# Patient Record
Sex: Male | Born: 1935 | Race: White | Hispanic: No | Marital: Single | Smoking: Former smoker
Health system: Southern US, Community
[De-identification: ages and names within clinical notes are randomized; demographics above are authoritative.]

## PROBLEM LIST (undated history)

## (undated) DIAGNOSIS — I639 Cerebral infarction, unspecified: Secondary | ICD-10-CM

## (undated) DIAGNOSIS — J449 Chronic obstructive pulmonary disease, unspecified: Secondary | ICD-10-CM

## (undated) DIAGNOSIS — I509 Heart failure, unspecified: Secondary | ICD-10-CM

---

## 2009-02-23 ENCOUNTER — Ambulatory Visit: Payer: Self-pay | Admitting: Rheumatology

## 2009-05-16 ENCOUNTER — Ambulatory Visit: Payer: Self-pay | Admitting: Cardiology

## 2009-06-19 ENCOUNTER — Ambulatory Visit: Payer: Self-pay | Admitting: Specialist

## 2009-11-26 ENCOUNTER — Ambulatory Visit: Payer: Self-pay | Admitting: General Practice

## 2009-12-12 ENCOUNTER — Inpatient Hospital Stay: Payer: Self-pay | Admitting: General Practice

## 2009-12-18 ENCOUNTER — Encounter: Payer: Self-pay | Admitting: Internal Medicine

## 2010-03-01 ENCOUNTER — Ambulatory Visit: Payer: Self-pay | Admitting: General Practice

## 2010-03-15 ENCOUNTER — Inpatient Hospital Stay: Payer: Self-pay | Admitting: General Practice

## 2010-04-26 ENCOUNTER — Encounter: Payer: Self-pay | Admitting: General Practice

## 2010-05-01 ENCOUNTER — Encounter: Payer: Self-pay | Admitting: General Practice

## 2010-09-04 ENCOUNTER — Ambulatory Visit: Payer: Self-pay | Admitting: Unknown Physician Specialty

## 2010-09-06 LAB — PATHOLOGY REPORT

## 2011-02-14 ENCOUNTER — Inpatient Hospital Stay: Payer: Self-pay | Admitting: Internal Medicine

## 2011-10-18 ENCOUNTER — Inpatient Hospital Stay: Payer: Self-pay | Admitting: Internal Medicine

## 2011-10-22 ENCOUNTER — Emergency Department: Payer: Self-pay | Admitting: *Deleted

## 2011-10-23 ENCOUNTER — Inpatient Hospital Stay: Payer: Self-pay

## 2012-03-17 ENCOUNTER — Other Ambulatory Visit: Payer: Self-pay

## 2012-10-25 ENCOUNTER — Inpatient Hospital Stay: Payer: Self-pay | Admitting: Internal Medicine

## 2012-10-26 LAB — COMPREHENSIVE METABOLIC PANEL
Albumin: 3.4 g/dL (ref 3.4–5.0)
Anion Gap: 5 — ABNORMAL LOW (ref 7–16)
BUN: 25 mg/dL — ABNORMAL HIGH (ref 7–18)
Bilirubin,Total: 0.5 mg/dL (ref 0.2–1.0)
Chloride: 96 mmol/L — ABNORMAL LOW (ref 98–107)
Co2: 34 mmol/L — ABNORMAL HIGH (ref 21–32)
EGFR (African American): >60
Glucose: 153 mg/dL — ABNORMAL HIGH (ref 65–99)
Osmolality: 278 (ref 275–301)
Potassium: 4 mmol/L (ref 3.5–5.1)
SGOT(AST): 29 U/L (ref 15–37)
SGPT (ALT): 19 U/L (ref 12–78)
Sodium: 135 mmol/L — ABNORMAL LOW (ref 136–145)
Total Protein: 7.1 g/dL (ref 6.4–8.2)

## 2012-10-26 LAB — CBC WITH DIFFERENTIAL/PLATELET
Basophil #: 0 10*3/uL (ref 0.0–0.1)
Basophil %: 0.1 %
Eosinophil %: 0.1 %
HCT: 36.9 % — ABNORMAL LOW (ref 40.0–52.0)
Lymphocyte #: 0.2 10*3/uL — ABNORMAL LOW (ref 1.0–3.6)
Lymphocyte %: 2.4 %
MCV: 83 fL (ref 80–100)
Monocyte %: 0.9 %
Neutrophil #: 6.8 10*3/uL — ABNORMAL HIGH (ref 1.4–6.5)
RBC: 4.46 10*6/uL (ref 4.40–5.90)
RDW: 14.5 % (ref 11.5–14.5)
WBC: 7.1 10*3/uL (ref 3.8–10.6)

## 2012-10-29 LAB — BASIC METABOLIC PANEL
Anion Gap: 3 — ABNORMAL LOW (ref 7–16)
BUN: 45 mg/dL — ABNORMAL HIGH (ref 7–18)
Calcium, Total: 9 mg/dL (ref 8.5–10.1)
Co2: 35 mmol/L — ABNORMAL HIGH (ref 21–32)
EGFR (African American): 54 — ABNORMAL LOW
EGFR (Non-African Amer.): 46 — ABNORMAL LOW
Glucose: 126 mg/dL — ABNORMAL HIGH (ref 65–99)
Osmolality: 281 (ref 275–301)
Potassium: 4.9 mmol/L (ref 3.5–5.1)

## 2012-10-29 LAB — CBC WITH DIFFERENTIAL/PLATELET
Basophil #: 0 10*3/uL (ref 0.0–0.1)
Basophil %: 0 %
Eosinophil #: 0 10*3/uL (ref 0.0–0.7)
HGB: 12.4 g/dL — ABNORMAL LOW (ref 13.0–18.0)
Lymphocyte %: 4.1 %
MCHC: 31.9 g/dL — ABNORMAL LOW (ref 32.0–36.0)
Monocyte %: 5.3 %
Neutrophil %: 90.6 %
Platelet: 227 10*3/uL (ref 150–440)
RBC: 4.68 10*6/uL (ref 4.40–5.90)
RDW: 14.3 % (ref 11.5–14.5)
WBC: 11.7 10*3/uL — ABNORMAL HIGH (ref 3.8–10.6)

## 2012-10-30 LAB — BASIC METABOLIC PANEL
Anion Gap: 3 — ABNORMAL LOW (ref 7–16)
Calcium, Total: 8.7 mg/dL (ref 8.5–10.1)
Co2: 35 mmol/L — ABNORMAL HIGH (ref 21–32)
EGFR (African American): >60
Osmolality: 283 (ref 275–301)

## 2012-10-30 LAB — CBC WITH DIFFERENTIAL/PLATELET
Basophil #: 0 10*3/uL (ref 0.0–0.1)
Basophil %: 0.1 %
Eosinophil #: 0 10*3/uL (ref 0.0–0.7)
Eosinophil %: 0 %
HGB: 12.6 g/dL — ABNORMAL LOW (ref 13.0–18.0)
Lymphocyte #: 0.6 10*3/uL — ABNORMAL LOW (ref 1.0–3.6)
Lymphocyte %: 5.3 %
MCV: 83 fL (ref 80–100)
Monocyte #: 0.9 x10 3/mm (ref 0.2–1.0)
Monocyte %: 7.3 %
Neutrophil %: 87.3 %
Platelet: 230 10*3/uL (ref 150–440)
RBC: 4.63 10*6/uL (ref 4.40–5.90)
RDW: 14.1 % (ref 11.5–14.5)
WBC: 11.8 10*3/uL — ABNORMAL HIGH (ref 3.8–10.6)

## 2012-10-30 LAB — PRO B NATRIURETIC PEPTIDE: B-Type Natriuretic Peptide: 227 pg/mL (ref 0–450)

## 2012-10-31 LAB — CBC WITH DIFFERENTIAL/PLATELET
Basophil #: 0 10*3/uL (ref 0.0–0.1)
Eosinophil %: 0.1 %
HCT: 37.6 % — ABNORMAL LOW (ref 40.0–52.0)
HGB: 11.9 g/dL — ABNORMAL LOW (ref 13.0–18.0)
MCH: 26.5 pg (ref 26.0–34.0)
MCV: 83 fL (ref 80–100)
Monocyte #: 0.8 x10 3/mm (ref 0.2–1.0)
Monocyte %: 6.9 %
Neutrophil %: 85.4 %
Platelet: 216 10*3/uL (ref 150–440)
RBC: 4.5 10*6/uL (ref 4.40–5.90)

## 2012-10-31 LAB — BASIC METABOLIC PANEL
Anion Gap: 3 — ABNORMAL LOW (ref 7–16)
Calcium, Total: 8.2 mg/dL — ABNORMAL LOW (ref 8.5–10.1)
Chloride: 101 mmol/L (ref 98–107)
Co2: 36 mmol/L — ABNORMAL HIGH (ref 21–32)
Glucose: 101 mg/dL — ABNORMAL HIGH (ref 65–99)
Osmolality: 286 (ref 275–301)
Potassium: 5 mmol/L (ref 3.5–5.1)

## 2012-10-31 LAB — OCCULT BLOOD X 1 CARD TO LAB, STOOL: Occult Blood, Feces: POSITIVE

## 2012-11-01 LAB — CBC WITH DIFFERENTIAL/PLATELET
Basophil #: 0 10*3/uL (ref 0.0–0.1)
Basophil %: 0.1 %
Eosinophil %: 0 %
HGB: 11.6 g/dL — ABNORMAL LOW (ref 13.0–18.0)
Lymphocyte %: 5.9 %
MCH: 26.1 pg (ref 26.0–34.0)
Neutrophil %: 88.2 %
Platelet: 220 10*3/uL (ref 150–440)
RBC: 4.45 10*6/uL (ref 4.40–5.90)
WBC: 11.7 10*3/uL — ABNORMAL HIGH (ref 3.8–10.6)

## 2012-11-01 LAB — BASIC METABOLIC PANEL
Anion Gap: 3 — ABNORMAL LOW (ref 7–16)
BUN: 32 mg/dL — ABNORMAL HIGH (ref 7–18)
Calcium, Total: 8.4 mg/dL — ABNORMAL LOW (ref 8.5–10.1)
Creatinine: 1.21 mg/dL (ref 0.60–1.30)
EGFR (African American): >60
EGFR (Non-African Amer.): 57 — ABNORMAL LOW
Glucose: 118 mg/dL — ABNORMAL HIGH (ref 65–99)
Osmolality: 282 (ref 275–301)
Potassium: 5.5 mmol/L — ABNORMAL HIGH (ref 3.5–5.1)

## 2012-11-02 LAB — HEMOGLOBIN: HGB: 11.1 g/dL — ABNORMAL LOW (ref 13.0–18.0)

## 2013-07-01 ENCOUNTER — Ambulatory Visit: Payer: Self-pay | Admitting: Physician Assistant

## 2013-07-01 ENCOUNTER — Observation Stay: Payer: Self-pay | Admitting: Internal Medicine

## 2013-07-02 LAB — CBC WITH DIFFERENTIAL/PLATELET
Basophil #: 0.1 10*3/uL (ref 0.0–0.1)
Eosinophil %: 2.8 %
HGB: 12.1 g/dL — ABNORMAL LOW (ref 13.0–18.0)
Lymphocyte %: 15.9 %
MCV: 80 fL (ref 80–100)
Monocyte #: 0.8 x10 3/mm (ref 0.2–1.0)
Neutrophil #: 6.3 10*3/uL (ref 1.4–6.5)
Neutrophil %: 71.6 %
RBC: 4.5 10*6/uL (ref 4.40–5.90)
RDW: 15.1 % — ABNORMAL HIGH (ref 11.5–14.5)

## 2013-07-02 LAB — COMPREHENSIVE METABOLIC PANEL
Albumin: 3.2 g/dL — ABNORMAL LOW (ref 3.4–5.0)
Alkaline Phosphatase: 48 U/L — ABNORMAL LOW (ref 50–136)
Bilirubin,Total: 0.3 mg/dL (ref 0.2–1.0)
Calcium, Total: 8.9 mg/dL (ref 8.5–10.1)
Chloride: 101 mmol/L (ref 98–107)
EGFR (African American): >60
Glucose: 83 mg/dL (ref 65–99)
Osmolality: 276 (ref 275–301)
Sodium: 138 mmol/L (ref 136–145)
Total Protein: 6.3 g/dL — ABNORMAL LOW (ref 6.4–8.2)

## 2014-06-21 DIAGNOSIS — I10 Essential (primary) hypertension: Secondary | ICD-10-CM | POA: Insufficient documentation

## 2015-03-20 NOTE — Consult Note (Signed)
Chief Complaint:   Subjective/Chief Complaint Please see full Gi consult.  Patient admitted with shortness of breath in the setting of known history of pulmonary fibrosis.  Patietn with some rectal bleeding, likely anal outlet in nature.  Hemodynamically stable, no acute significant drop of hgb. Please see full consult for recomendations. Following.   VITAL SIGNS/ANCILLARY NOTES: **Vital Signs.:   02-Dec-13 13:18   Vital Signs Type Routine   Temperature Temperature (F) 98.1   Celsius 36.7   Temperature Source oral   Pulse Pulse 86   Respirations Respirations 18   Systolic BP Systolic BP 97   Diastolic BP (mmHg) Diastolic BP (mmHg) 60   Mean BP 72   Pulse Ox % Pulse Ox % 95   Pulse Ox Activity Level  At rest   Oxygen Delivery 2L   Electronic Signatures: Barnetta ChapelSkulskie, Martin (MD)  (Signed 02-Dec-13 19:46)  Authored: Chief Complaint, VITAL SIGNS/ANCILLARY NOTES   Last Updated: 02-Dec-13 19:46 by Barnetta ChapelSkulskie, Martin (MD)

## 2015-03-20 NOTE — Discharge Summary (Signed)
PATIENT NAME:  Kyle CockerHOLLAND, Kyle Frederick MR#:  [id number] DATE OF BIRTH:  [birth year]  DATE OF ADMISSION:  10/25/2012 DATE OF DISCHARGE:  11/03/2012  REASON FOR ADMISSION: Worsening shortness of breath.   HISTORY OF PRESENT ILLNESS: Please see the dictated history and physical done by DR. Walker, III on 10/25/2012.   PAST MEDICAL HISTORY:  1.  Previous stroke with partial expressive aphasia.  2.  Benign hypertension.  3.  Pulmonary hypertension.  4.  Interstitial lung disease.  5.  Seronegative inflammatory arthritis.  6.  Osteoarthritis.  7.  Mitral regurgitation.  8.  Cardiomegaly.  9.  Status post bilateral knee surgeries.  10. History of prostate biopsy.  11. Status post left inguinal hernia repair.   MEDICATIONS ON ADMISSION: Please see admission note.   ALLERGIES: QUESTIONABLE ALLERGY TO LASIX.   SOCIAL HISTORY: Negative for alcohol or tobacco abuse.   FAMILY HISTORY: Positive for heart disease and cerebrovascular disease.   REVIEW OF SYSTEMS: As per history of present illness.   PHYSICAL EXAMINATION:  GENERAL: The patient was congested in mild respiratory distress.   VITAL SIGNS: Remarkable for temperature of 100.1 with a heart rate of 106 and a pressure of 119/70. Saturations were 92% on 2 L.  HEENT: Normocephalic, atraumatic. Pupils equally round and reactive to light and accommodation. Extraocular movements are intact. Sclerae are anicteric. Conjunctivae are clear. Oropharynx was dry, but clear.  NECK: Supple without JVD.  LUNGS: Scattered rhonchi. No wheezes or rales.  CARDIAC: Irregularly irregular rhythm with a rapid rate. No murmurs, or gallops.  ABDOMEN: Soft and nontender.  EXTREMITIES: Without edema.  NEUROLOGIC: Grossly nonfocal.   HOSPITAL COURSE: The patient was admitted with acute on chronic respiratory failure. The patient was treated conservatively with IV antibiotics and high-dose steroids with DuoNeb SVNs. He was seen by pulmonology who agreed with the  recommendations. However, during the hospitalization, there was some question in regards to GI bleed. He was seen in consultation by gastroenterology, who felt that the patient had rectal bleeding, presumably diverticular. He was monitored closely. During the hospitalization, echocardiogram was obtained and revealed an ejection fraction of greater than 55%. No mitral regurgitation was noted on this echo. The patient was subsequently weaned to oral steroids and oral antibiotics. He continued to do well. He was maintained on oxygen at 2 L. By 11/03/2012, the patient was stable and ready for discharge.   DISCHARGE DIAGNOSES:  1.  Acute on chronic respiratory failure.  2.  Chronic obstructive pulmonary disease exacerbation.  3.  Pneumonia.  4.  Rectal bleeding, questionable diverticular.  5.  Hyperlipidemia.  6.  Seronegative inflammatory arthritis.  7.  Benign prostatic hypertrophy.   DISCHARGE MEDICATIONS:  1.  Aspirin 81 mg p.o. daily.  2.  Flomax 0.4 mg p.o. at bedtime.  3.  Zocor 20 mg p.o. daily.  4.  Zestoretic 20/25, 1 p.o. daily.  5.  Spiriva 1 capsule inhaled daily.  6.  Proscar 5 mg p.o. daily.  7.  Cardizem CD 180 mg p.o. daily.  8.  Prednisone taper as directed.   FOLLOW-UP PLANS AND APPOINTMENTS: The patient was discharged on home oxygen with home health. He was on a regular diet. He will follow with Dr. Dan HumphreysWalker within 1 to 2 weeks' time period, sooner if needed.   TOTAL TIME SPENT: 45 minutes.     ____________________________ Duane LopeJeffrey Frederick. Judithann SheenSparks, MD jds:aw Frederick: 11/17/2012 07:15:00 ET T: 11/17/2012 08:25:12 ET JOB#: [id number]  cc: Letta PateJohn B. Danne HarborWalker III, MD Duane LopeJeffrey Frederick. Tinsleigh Slovacek,  MD, <Dictator>   Ariyannah Pauling Rodena Medin MD ELECTRONICALLY SIGNED 11/17/2012 20:51

## 2015-03-20 NOTE — Consult Note (Signed)
PATIENT NAME:  Kyle Frederick, Kyle Frederick MR#:  [id number] DATE OF BIRTH:  [birth year]  DATE OF CONSULTATION:  11/01/2012  REFERRING PHYSICIAN:  Dr. Dan Humphreys  CONSULTING PHYSICIAN:  Keturah Barre, NP  REASON FOR CONSULTATION: Possible GI bleed.    HISTORY OF PRESENT ILLNESS: We appreciate this consult for this 81 year old Caucasian man admitted with pneumonia/pulmonary fibrosis/COPD exacerbation. Noted he has had one bloody and heme positive stool. This was yesterday. Reports a history of chronic constipation that he treats at home with Dulcolax tablets. States he needs to strain with defecation chronically and having hard stools. States he has had one bowel movement after two suppositories during this hospitalization yesterday. He states that there was no stool but it was bright red blood in the toilet and that he could see the toilet bottom. Denies abdominal pain, cramping, nausea, and further GI-related complaints with the exception of dysphagia and heartburn for which he takes Rolaids at home for this but no PPI. States follow-up in the past. Did have colonoscopy in 2011 with one adenoma, three hyperplastic polyps, diverticula, and internal hemorrhoids. Also had EGD at the time with esophagitis, gastritis, and duodenitis. There was H. pylori noted. He was treated with a press pack.   PAST MEDICAL HISTORY:  1. Hypertension. 2. CVA. 3. Partial expressive aphasia.  4. Interstitial lung disease. 5. Mild pulmonary hypertension. 6. Cardiomegaly. 7. Mitral regurgitation. 8. Seronegative inflammatory arthritis. 9. Osteoarthritis. 10. Bilateral knee replacements. 11. Prostate biopsy. 12. Left inguinal hernia repair.   ALLERGIES: Faintness after taking a dose of Lasix but no other allergies.    MEDICATIONS:  1. Ecotrin 325 mg p.o. daily.  2. Spiriva 1 puff daily. 3. Simvastatin 20 mg p.o. daily.  4. Lisinopril 5 mg p.o. daily.  5. Flomax 0.4 mg p.o. daily.   FAMILY HISTORY: Significant for  heart disease and CVA.  REVIEW OF SYSTEMS: 10 point review is essentially negative with the exception of what is written above and his shortness of breath on admission. States that his breathing is feeling better, although it is not back to 100%.   MOST RECENT LABS: Glucose 118, BUN 32, creatinine 1.21, sodium 137, potassium 5.5, chloride 99, GFR 57, calcium 8.4, WBC 11.7, hemoglobin 11.6, hematocrit 37, platelet count 220.   PHYSICAL EXAMINATION:   MOST RECENT VITAL SIGNS: Temperature 98.1, pulse 75, respiratory rate 18, blood pressure 132/64, SAO2 96% on 2 liters oxygen.   GENERAL: Elderly man resting comfortably in bed in no acute distress.   HEENT: Normocephalic, atraumatic. No redness, drainage, or inflammation to the eyes or the nares. Sclerae anicteric.   LUNGS: Without JVD, lymphadenopathy, or thyromegaly.   LUNGS: Respirations eupneic. Able to speak in complete sentences. Bilateral wheezing and rhonchi noted. Mild cough.   CARDIAC: S1, S2, regular rate and rhythm. No MRG. Trace lower extremity edema. Peripheral pulses 2+.   ABDOMEN: Somewhat protuberant. Bowel sounds x4, very soft, nontender, nondistended. No guarding, rigidity, peritoneal signs, hepatosplenomegaly, or other abnormalities noted.   RECTAL: No external hemorrhoids. Did note some hard stool in rectal vault. No frank bleeding and no blackness on glove. No melena on glove either.   GU: Deferred.   EXTREMITIES: Moves all extremities well x4. Strength 5/5. Sensation intact. No cyanosis.   NEUROLOGICAL: Cranial nerves II through XII intact. Alert and oriented x3. Did note some partial expressive aphasia but is able to communicate well.   PSYCHIATRIC: Pleasant, cooperative, calm, logical thought.   IMPRESSION AND PLAN:  1. Rectal bleeding. Differentials include anal  outlet, diverticular, other. This is a single episode yesterday so likely anal outlet. Would like to improve stool softness. Have recommended Citrucel  once daily and MiraLAX daily for now with Dulcolax p.r.n. only. Do recommend following hemoglobin and monitoring for further signs of bleeding. 2. Gastroesophageal reflux disease and dysphagia. The patient states this is a longstanding problem, not on PPI currently. Will assess H. pylori serologies. Pantoprazole 40 mg p.o. daily. May or may not need barium swallow this visit.  These services were provided by Kyle Pathristiane Sumiya Mamaril, MSN, [facility] in collaboration with Barnetta ChapelMartin Skulskie, MD with whom I have discussed this patient in full.   Thank you for this consult.   ____________________________ Keturah Barrehristiane H. Arsh Feutz, NP chl:drc D: 11/02/2012 11:32:00 ET T: 11/02/2012 11:53:36 ET JOB#: [id number]  cc: Keturah Barrehristiane H. Jaszmine Navejas, NP, <Dictator> Eustaquio MaizeHRISTIANE H Ajahnae Rathgeber FNP ELECTRONICALLY SIGNED 11/10/2012 10:55

## 2015-03-20 NOTE — Consult Note (Signed)
Chief Complaint:   Subjective/Chief Complaint no further rectal bleeding, tolerating regular diet.  no nausea or abdominal pain.  patient reports breathing much better.   VITAL SIGNS/ANCILLARY NOTES: **Vital Signs.:   03-Dec-13 14:30   Vital Signs Type Routine   Temperature Temperature (F) 98.4   Celsius 36.8   Temperature Source Oral   Pulse Pulse 77   Respirations Respirations 20   Systolic BP Systolic BP 127   Diastolic BP (mmHg) Diastolic BP (mmHg) 61   Mean BP 83   Pulse Ox % Pulse Ox % 95   Pulse Ox Activity Level  At rest   Oxygen Delivery 2L   Brief Assessment:   Cardiac Regular    Respiratory wheezing  rhonchi  bilateral    Gastrointestinal details normal Soft  Nontender  Nondistended  No masses palpable  Bowel sounds normal   Lab Results: Cardiology:  03-Dec-13 08:08    Echo Doppler  Interpretation Summary   Left ventricular systolic function is normal. Ejection Fraction =  >55%. The left atrium is mildly dilated. Mild pulmonic valvular  regurgitation. The right ventricle is mildly dilated. There is no  mitral regurgitationnoted. No pericardial effusion  Procedure:   A two-dimensional transthoracic echocardiogram with color flow and  Doppler was performed.  Left Ventricle   Left ventricular systolic function is normal.   Ejection Fraction = >55%.  Right Ventricle   The right ventricle is mildly dilated.  Atria   The left atrium is mildly dilated.  Mitral Valve   The mitral valve is not well visualized.   There is no mitral regurgitation noted.  Tricuspid Valve   The tricuspid valve is not well visualized.   There is trace tricuspid regurgitation.  Aortic Valve   The aortic valve is not well visualized.   No aortic regurgitation is present.  Pulmonic Valve   Mild pulmonic valvular regurgitation.  Pericardium/Pleural   No pericardial effusion.  MMode 2D Measurements and Calculations   RVDd: 4.5 cm   IVSd: 1.1 cm   LVIDd: 4.8 cm  LVIDs: 3.0 cm   LVPWd: 1.1 cm   FS: 38 %   EF(Teich): 68 %   Ao root diam: 3.4 cm   LA dimension: 4.9 cm   LVOT diam: 2.2 cm  Doppler Measurements and Calculations   MV E point: 64 cm/sec   MV A point: 83 cm/sec   MV E/A: 0.76    MV dec time: 0.41 sec   Ao V2 max: 98 cm/sec   Ao max PG: 4.0 mmHg   AVA(V,D): 2.6 cm2   LV max PG: 2.0 mmHg   LV V1 max: 66 cm/sec   PA V2 max: 92cm/sec   PA max PG: 3.0 mmHg   TR Max vel: 181 cm/sec   TR Max PG: 13 mmHg   RVSP: 18 mmHg   RAP systole: 5.0 mmHg  Reading Physician: Harold Hedge  Sonographer: Cristela Blue Interpreting Physician:  Harold Hedge,  electronically signed on 11-02-2012 13:45:27 Requesting Physician: Harold Hedge  Routine Hem:  03-Dec-13 05:01    Hemoglobin (CBC)  11.1 (Result(s) reported on 02 Nov 2012 at 05:57AM.)   Assessment/Plan:  Assessment/Plan:   Assessment 1) copd/pulmonary fibrosis improved over admission.  2) rectal bleeding-history of constipation, large bm yesterday without bleeding. likely anal outlet bleeding.   now on miralax and citrucel.    Plan 1) not a candidate for luminal evaluation currently due to recent exacerbation of lung disease.  recommend non-sedated proceedure such as CT colonography  as outpatient.  We can arrange this in GI followup.  Have patietn see GI as outpatient in 2-3 weeks.  Will sign off.  reconsult if needed.   Electronic Signatures: Barnetta ChapelSkulskie, Bannon Giammarco (MD)  (Signed 03-Dec-13 17:06)  Authored: Chief Complaint, VITAL SIGNS/ANCILLARY NOTES, Brief Assessment, Lab Results, Assessment/Plan   Last Updated: 03-Dec-13 17:06 by Barnetta ChapelSkulskie, Taegan Haider (MD)

## 2015-03-20 NOTE — Consult Note (Signed)
Brief Consult Note: Diagnosis: pneumonia/COPD exacerbation.   Patient was seen by consultant.   Consult note dictated.   Comments: Appreciate consult for 81 y/o caucasion man admitted with pneumonia/pulmonary fibrosis/COPD exacerabtion for evaluation of bloody/heme positive stool. Reports a history of chronic constipation that he treats at home with Dulcolax tablets. States needing to strain with defecation chronically and having hard stools. States he has had one bowel movement after 2 suppositories- this was yesterday, states that there was no stool, that it was bright red blood in the toilet, although he could see the toilet bottom. Denies abdominal pain, cramping, nausea, and further GI related complaints with the exception of dysphagia and heartburn. Takes Rolaids at home for this, but no PPI. States that both solids and liquids are hard to pass. Did have colonoscopy 2011 with one adenoma, 3 hyperplastic polyps, diverticula, internal hemorrhoids. Also had EGD at that time with esophagitis, gastritis, and duodenitis. There was H pylori noted and he was treated with a Prevpac. Abdomen nondistended, nontender with active bowel sounds. Rectal exam without frank bleeding, melena, and was nontender. Hgb is stable since admission.  Impression and plan. Rectal bleeding- differentials include anal outlet, diverticular, other. This was a single episode yesterday- so likely anal outlet. Would like to improved stool softness- have recommended citrucel once daily and miralax once daily for now. Would save dulcolax to prn only. Do recommend following hemoglobin and monitoring for further bleeding.                                GERD and dysphagia.Patient states this is a long standing problem.  Not on PPI currently. Will assess H pylori serologies and write for Pantoprazole 40mg  po daily. May need barium swallow.  Electronic Signatures: Vevelyn PatLondon, Takeysha Bonk H (NP)  (Signed 02-Dec-13 15:00)  Authored: Brief  Consult Note   Last Updated: 02-Dec-13 15:00 by Keturah BarreLondon, Tamee Battin H (NP)

## 2015-03-20 NOTE — H&P (Signed)
PATIENT NAME:  Kyle Frederick, Kyle D MR#:  [id number] DATE OF BIRTH:  [birth year]  DATE OF ADMISSION:  10/25/2012  HISTORY OF PRESENT ILLNESS: Kyle Frederick is a 81 year old white gentleman with known pulmonary fibrosis. He went to see his pulmonologist, Dr. Meredeth IdeFleming, today for routine follow up. He was noted to be very congested and short of breath. Pulse ox on 2 liters was reportedly 83%. The patient was therefore sent over for direct admission. Patient states he has had chest congestion with purulent sputum for several days. He thinks he has had a low-grade fever.   PAST MEDICAL HISTORY:  1. Hypertension.  2. He also has had a previous cerebrovascular accident and has a partial expressive aphasia.  3. He has known interstitial lung disease with mild pulmonary hypertension.  4. He also has a history of cardiomegaly with mitral regurgitation.  5. He has a history of a seronegative inflammatory arthritis that has been followed Dr. Saverio DankerWallace Kernodle.  6. Also had a history of osteoarthritis.   PAST SURGICAL HISTORY:  1. Bilateral knee replacements.  2. Prostate biopsy.  3. Left inguinal hernia repair.   ALLERGIES: Patient has no known drug allergies although on one episode he felt faint after taking a dose of Lasix.   MEDICATIONS:  1. Ecotrin 325 mg daily.  2. Spiriva 1 puff daily.  3. Simvastatin 20 mg daily.  4. Lisinopril 5 mg daily.  5. Flomax 0.4 mg daily.   SOCIAL HISTORY: He does not smoke or drink.   FAMILY HISTORY: Positive for heart disease and cerebrovascular disease.   REVIEW OF SYSTEMS: Patient's review of systems is essentially unremarkable with the exception of the present illness.   PHYSICAL EXAMINATION: VITAL SIGNS: Admission exam reveals a temperature of 100.1, a pulse of 106, a blood pressure of 119/70, and a pulse oximetry which is presently 92% on 2 liters.   GENERAL: This is an elderly gentleman who sounds very congested. His color is good on oxygen. He is flushed and  warm to the touch. There are no rashes or petechiae.   HEENT: Examination of the head, eyes, ears, nose and throat was basically within normal limits.   NECK: The neck was supple. There was no JVD. There were no carotid bruits.   LUNGS: Auscultation of the chest revealed bilateral rhonchi. Expiratory phase is prolonged.   CARDIAC: Irregular tachycardia but no murmurs or gallops.   ABDOMEN: Soft and nontender without masses or organomegaly. Bowel sounds were normal.   GENITAL/RECTAL: Deferred.   EXTREMITIES: No edema or deformity.   NEUROLOGIC: Examination was most notable for his partial expressive aphasia but otherwise was nonfocal.   ASSESSMENT AND PLAN: Patient is being admitted to the regular medical floor where a sputum culture will be obtained. Routine lab and chest x-ray will be obtained. Sputum for culture is being obtained. The patient is being started on IV Levaquin pending culture reports. Patient will also be placed on high dose steroids. He will also be placed on DuoNeb q.4 hours around-the-clock while awake. Consultation from his pulmonologist will be requested in the morning.   ____________________________ Letta PateJohn B. Danne HarborWalker III, MD jbw:cms D: 10/25/2012 18:29:26 ET T: 10/26/2012 05:41:06 ET  JOB#: [id number] cc: Jonny RuizJohn B. Danne HarborWalker III, MD, <Dictator> Elmo PuttJOHN B WALKER III MD ELECTRONICALLY SIGNED 10/26/2012 7:34

## 2015-03-23 NOTE — H&P (Signed)
PATIENT NAME:  Kyle Frederick, Kyle Frederick MR#:  [id number] DATE OF BIRTH:  27-Jul-1931  DATE OF ADMISSION:  07/01/2013  CHIEF COMPLAINT: Nausea and vomiting.   HISTORY OF PRESENT ILLNESS: This 81 year old presented to the clinic this morning with complaints of right lower quadrant pain for 3 weeks. He was subsequently sent for CT of the abdomen and pelvis and developed nausea and vomiting during the process. He has continued to have persistent nausea with occasional vomiting and no hematemesis. His abdominal pain has persisted. His CAT scan revealed enlarged prostate concerning for prostatitis versus malignancy. He had a urinalysis showing white blood cells 2 to 5 and rare bacteria. His WBCs were 7.9 with a  hemoglobin 12.1. His creatinine was 1.2 at baseline with a BUN of 20. His right lower quadrant pain has been constant with no aggravating or relieving factors. It is not affected by movement or bowel movements. He does have a history of chronic constipation. His last BM was this morning and was normal. He denies any bright red blood per rectum. Denies any fevers or chills. He does not feel any bulging in the area nor rashes. He does have a history of CVA with aphasia and has some difficulty with communication.   PAST MEDICAL HISTORY:  1.  Status post CVA with aphasia.  2.  Hypertension.  3.  Interstitial lung disease with honeycombing, fibrosis and pulmonary hypertension.   4.  Cardiomegaly with mitral regurgitation. 5.  History of inflammatory arthritis, seronegative.  6.  Osteoarthritis.  7.  Vertigo, positional.   PAST SURGICAL HISTORY: Involves: 1.  Left inguinal hernia repair.  2.  Prostate biopsy x 2. 3.  Bilateral knee replacements.   CURRENT MEDICATIONS:  1.  Aspirin 325 mg daily.  2.  Spiriva 1 inhalation daily.  3.  Simvastatin 20 mg at bedtime.  4.  Lisinopril 10 mg daily.  5.  Flomax 0.4 mg daily.  6.  Cardizem CD 180 mg daily.  7.  Hydroxyzine 25 mg every 6 hours as needed.    ALLERGIES: LASIX causing him to feel faint.   PHYSICAL EXAMINATION:   VITAL SIGNS: Weight is 245, temperature 97.5, blood pressure 110/50 and pulse is 80.  GENERAL: No acute distress.  HEENT: PERRLA. EOMs intact. Bilateral TMs are occluded by wax. No sinus tenderness with palpation. Posterior pharynx is clear.   NECK: Supple with no adenopathy or carotid bruits.  HEART: Regular rate and rhythm with no bruits murmurs, rubs, or gallops.  LUNGS: Show diffuse dry crackles bilaterally in the bases.  EXTREMITIES: No edema or clubbing,  ABDOMEN: Soft with point tenderness in the right lower quadrant with some rebound and guarding. He does have positive bowel sounds. No hepatosplenomegaly. No rash noted.   SOCIAL HISTORY: Denies currently tobacco use or alcohol use. He lives alone and has no family members to help provide assistance.   FAMILY HISTORY: Noncontributory.   ASSESSMENT AND PLAN:  1.  Nausea, vomiting and dehydration post abdominal CT with contrast. Right lower quadrant pain. He is admitted for observation and rehydration. He was given normal saline 125 mL an hour. Zofran 4 mg intravenous every 8 hours p.r.n. Liquid diet as tolerated. Repeat CBC, MET-C in the morning.  2.  Right lower quadrant pain. CT of the abdomen and pelvis today revealing enlarged prostate consistent with prostatitis versus malignancy. He is placed on Cipro 4 mg intravenous every 12 hours. We will anticipate urology followup outpatient.  3.  Interstitial lung disease. Continue Spiriva. He does  use supplemental oxygen at home only with exertion. We will not order this here in the hospital. His O2 saturation is 95% on room air in the office.  4.  Hypertension. Pressure 110/80 in the office. We will hold blood pressure medicine.  5.  Hyperlipidemia. We will continue his simvastatin.  ____________________________ Marily Lente. Tumey, PA-C rjt:aw D: 07/01/2013 15:59:04 ET T: 07/01/2013 16:25:14  ET JOB#: [id number]  cc: Marily Lente. Lolita Lenz, <Dictator> Leonard Downing PA ELECTRONICALLY SIGNED 07/01/2013 21:13

## 2017-11-12 DIAGNOSIS — D638 Anemia in other chronic diseases classified elsewhere: Secondary | ICD-10-CM | POA: Insufficient documentation

## 2018-04-25 ENCOUNTER — Encounter: Payer: Self-pay | Admitting: Emergency Medicine

## 2018-04-25 ENCOUNTER — Inpatient Hospital Stay (HOSPITAL_COMMUNITY)
Admit: 2018-04-25 | Discharge: 2018-04-25 | Disposition: A | Discharge status: Home / Self Care | Payer: Medicare Other | Attending: Adult Health | Admitting: Adult Health

## 2018-04-25 ENCOUNTER — Other Ambulatory Visit: Payer: Self-pay

## 2018-04-25 ENCOUNTER — Inpatient Hospital Stay
Admission: EM | Admit: 2018-04-25 | Discharge: 2018-04-28 | DRG: 189 | Disposition: A | Discharge status: Home Health | Payer: Medicare Other | Attending: Internal Medicine | Admitting: Internal Medicine

## 2018-04-25 ENCOUNTER — Emergency Department: Payer: Medicare Other

## 2018-04-25 DIAGNOSIS — I890 Lymphedema, not elsewhere classified: Secondary | ICD-10-CM | POA: Diagnosis present

## 2018-04-25 DIAGNOSIS — E1122 Type 2 diabetes mellitus with diabetic chronic kidney disease: Secondary | ICD-10-CM | POA: Diagnosis present

## 2018-04-25 DIAGNOSIS — I5033 Acute on chronic diastolic (congestive) heart failure: Secondary | ICD-10-CM | POA: Diagnosis present

## 2018-04-25 DIAGNOSIS — J8410 Pulmonary fibrosis, unspecified: Secondary | ICD-10-CM | POA: Diagnosis present

## 2018-04-25 DIAGNOSIS — J9602 Acute respiratory failure with hypercapnia: Secondary | ICD-10-CM | POA: Diagnosis not present

## 2018-04-25 DIAGNOSIS — D631 Anemia in chronic kidney disease: Secondary | ICD-10-CM | POA: Diagnosis present

## 2018-04-25 DIAGNOSIS — Z8673 Personal history of transient ischemic attack (TIA), and cerebral infarction without residual deficits: Secondary | ICD-10-CM | POA: Diagnosis not present

## 2018-04-25 DIAGNOSIS — J441 Chronic obstructive pulmonary disease with (acute) exacerbation: Secondary | ICD-10-CM

## 2018-04-25 DIAGNOSIS — I371 Nonrheumatic pulmonary valve insufficiency: Secondary | ICD-10-CM

## 2018-04-25 DIAGNOSIS — J440 Chronic obstructive pulmonary disease with acute lower respiratory infection: Secondary | ICD-10-CM | POA: Diagnosis present

## 2018-04-25 DIAGNOSIS — J9621 Acute and chronic respiratory failure with hypoxia: Secondary | ICD-10-CM | POA: Diagnosis present

## 2018-04-25 DIAGNOSIS — N183 Chronic kidney disease, stage 3 (moderate): Secondary | ICD-10-CM | POA: Diagnosis present

## 2018-04-25 DIAGNOSIS — N179 Acute kidney failure, unspecified: Secondary | ICD-10-CM | POA: Diagnosis present

## 2018-04-25 DIAGNOSIS — E875 Hyperkalemia: Secondary | ICD-10-CM | POA: Diagnosis present

## 2018-04-25 DIAGNOSIS — Z6841 Body Mass Index (BMI) 40.0 and over, adult: Secondary | ICD-10-CM | POA: Diagnosis not present

## 2018-04-25 DIAGNOSIS — J9622 Acute and chronic respiratory failure with hypercapnia: Principal | ICD-10-CM | POA: Diagnosis present

## 2018-04-25 DIAGNOSIS — M1990 Unspecified osteoarthritis, unspecified site: Secondary | ICD-10-CM | POA: Diagnosis present

## 2018-04-25 DIAGNOSIS — J9811 Atelectasis: Secondary | ICD-10-CM | POA: Diagnosis present

## 2018-04-25 DIAGNOSIS — H9190 Unspecified hearing loss, unspecified ear: Secondary | ICD-10-CM | POA: Diagnosis present

## 2018-04-25 DIAGNOSIS — I130 Hypertensive heart and chronic kidney disease with heart failure and stage 1 through stage 4 chronic kidney disease, or unspecified chronic kidney disease: Secondary | ICD-10-CM | POA: Diagnosis present

## 2018-04-25 DIAGNOSIS — L8990 Pressure ulcer of unspecified site, unspecified stage: Secondary | ICD-10-CM

## 2018-04-25 DIAGNOSIS — J849 Interstitial pulmonary disease, unspecified: Secondary | ICD-10-CM | POA: Insufficient documentation

## 2018-04-25 DIAGNOSIS — J209 Acute bronchitis, unspecified: Secondary | ICD-10-CM | POA: Diagnosis present

## 2018-04-25 DIAGNOSIS — I69320 Aphasia following cerebral infarction: Secondary | ICD-10-CM | POA: Insufficient documentation

## 2018-04-25 DIAGNOSIS — Z9981 Dependence on supplemental oxygen: Secondary | ICD-10-CM | POA: Diagnosis not present

## 2018-04-25 DIAGNOSIS — Z66 Do not resuscitate: Secondary | ICD-10-CM | POA: Diagnosis present

## 2018-04-25 DIAGNOSIS — L89159 Pressure ulcer of sacral region, unspecified stage: Secondary | ICD-10-CM | POA: Diagnosis present

## 2018-04-25 DIAGNOSIS — I5031 Acute diastolic (congestive) heart failure: Secondary | ICD-10-CM

## 2018-04-25 DIAGNOSIS — I272 Pulmonary hypertension, unspecified: Secondary | ICD-10-CM | POA: Diagnosis present

## 2018-04-25 DIAGNOSIS — E785 Hyperlipidemia, unspecified: Secondary | ICD-10-CM | POA: Diagnosis present

## 2018-04-25 DIAGNOSIS — Z888 Allergy status to other drugs, medicaments and biological substances status: Secondary | ICD-10-CM

## 2018-04-25 DIAGNOSIS — J84112 Idiopathic pulmonary fibrosis: Secondary | ICD-10-CM | POA: Diagnosis not present

## 2018-04-25 HISTORY — DX: Heart failure, unspecified: I50.9

## 2018-04-25 HISTORY — DX: Chronic obstructive pulmonary disease, unspecified: J44.9

## 2018-04-25 LAB — COMPREHENSIVE METABOLIC PANEL
ALK PHOS: 49 U/L (ref 38–126)
ALT: 10 U/L — AB (ref 17–63)
AST: 18 U/L (ref 15–41)
Albumin: 4 g/dL (ref 3.5–5.0)
Anion gap: 5 (ref 5–15)
BILIRUBIN TOTAL: 0.4 mg/dL (ref 0.3–1.2)
BUN: 33 mg/dL — ABNORMAL HIGH (ref 6–20)
CALCIUM: 9.5 mg/dL (ref 8.9–10.3)
CO2: 37 mmol/L — AB (ref 22–32)
CREATININE: 1.44 mg/dL — AB (ref 0.61–1.24)
Chloride: 98 mmol/L — ABNORMAL LOW (ref 101–111)
GFR, EST AFRICAN AMERICAN: 50 mL/min — AB (ref >60)
GFR, EST NON AFRICAN AMERICAN: 43 mL/min — AB (ref >60)
Glucose, Bld: 97 mg/dL (ref 65–99)
Potassium: 4.9 mmol/L (ref 3.5–5.1)
SODIUM: 140 mmol/L (ref 135–145)
TOTAL PROTEIN: 7.7 g/dL (ref 6.5–8.1)

## 2018-04-25 LAB — BLOOD GAS, ARTERIAL
ACID-BASE EXCESS: 9.1 mmol/L — AB (ref 0.0–2.0)
Bicarbonate: 36.2 mmol/L — ABNORMAL HIGH (ref 20.0–28.0)
FIO2: 0.28
O2 SAT: 97.6 %
PATIENT TEMPERATURE: 37
pCO2 arterial: 64 mmHg — ABNORMAL HIGH (ref 32.0–48.0)
pH, Arterial: 7.36 (ref 7.350–7.450)
pO2, Arterial: 102 mmHg (ref 83.0–108.0)

## 2018-04-25 LAB — CBC WITH DIFFERENTIAL/PLATELET
Basophils Absolute: 0 10*3/uL (ref 0–0.1)
Basophils Relative: 0 %
EOS PCT: 4 %
Eosinophils Absolute: 0.3 10*3/uL (ref 0–0.7)
HCT: 31.1 % — ABNORMAL LOW (ref 40.0–52.0)
Hemoglobin: 9.8 g/dL — ABNORMAL LOW (ref 13.0–18.0)
LYMPHS PCT: 22 %
Lymphs Abs: 1.4 10*3/uL (ref 1.0–3.6)
MCH: 25.9 pg — AB (ref 26.0–34.0)
MCHC: 31.5 g/dL — AB (ref 32.0–36.0)
MCV: 82.2 fL (ref 80.0–100.0)
MONO ABS: 0.6 10*3/uL (ref 0.2–1.0)
MONOS PCT: 9 %
NEUTROS ABS: 4.1 10*3/uL (ref 1.4–6.5)
Neutrophils Relative %: 65 %
Platelets: 185 10*3/uL (ref 150–440)
RBC: 3.78 MIL/uL — ABNORMAL LOW (ref 4.40–5.90)
RDW: 14.7 % — AB (ref 11.5–14.5)
WBC: 6.3 10*3/uL (ref 3.8–10.6)

## 2018-04-25 LAB — LACTIC ACID, PLASMA: Lactic Acid, Venous: 0.9 mmol/L (ref 0.5–1.9)

## 2018-04-25 LAB — BLOOD GAS, VENOUS
Acid-Base Excess: 15 mmol/L — ABNORMAL HIGH (ref 0.0–2.0)
Bicarbonate: 43.7 mmol/L — ABNORMAL HIGH (ref 20.0–28.0)
PATIENT TEMPERATURE: 37
pCO2, Ven: 81 mmHg (ref 44.0–60.0)
pH, Ven: 7.34 (ref 7.250–7.430)

## 2018-04-25 LAB — TROPONIN I: Troponin I: <0.03 ng/mL (ref <0.03)

## 2018-04-25 LAB — MRSA PCR SCREENING: MRSA BY PCR: NEGATIVE

## 2018-04-25 LAB — BRAIN NATRIURETIC PEPTIDE: B Natriuretic Peptide: 31 pg/mL (ref 0.0–100.0)

## 2018-04-25 LAB — GLUCOSE, CAPILLARY: Glucose-Capillary: 88 mg/dL (ref 65–99)

## 2018-04-25 MED ORDER — ACETAMINOPHEN 325 MG PO TABS: 650.0000 mg | ORAL_TABLET | Freq: Four times a day (QID) | ORAL | Status: DC | PRN

## 2018-04-25 MED ORDER — SENNOSIDES-DOCUSATE SODIUM 8.6-50 MG PO TABS: 1.0000 | ORAL_TABLET | Freq: Every evening | ORAL | Status: DC | PRN

## 2018-04-25 MED ORDER — ENOXAPARIN SODIUM 40 MG/0.4ML ~~LOC~~ SOLN
40.0000 mg | SUBCUTANEOUS | Status: DC
  Administered 2018-04-25 – 2018-04-28 (×4): 40 mg via SUBCUTANEOUS
  Filled 2018-04-25 (×4): qty 0.4

## 2018-04-25 MED ORDER — IPRATROPIUM-ALBUTEROL 0.5-2.5 (3) MG/3ML IN SOLN
3.0000 mL | RESPIRATORY_TRACT | Status: DC
  Administered 2018-04-25 (×3): 3 mL via RESPIRATORY_TRACT
  Filled 2018-04-25 (×2): qty 3

## 2018-04-25 MED ORDER — IPRATROPIUM-ALBUTEROL 0.5-2.5 (3) MG/3ML IN SOLN
3.0000 mL | Freq: Once | RESPIRATORY_TRACT | Status: AC
  Administered 2018-04-25: 3 mL via RESPIRATORY_TRACT
  Filled 2018-04-25: qty 3

## 2018-04-25 MED ORDER — SIMVASTATIN 20 MG PO TABS
20.0000 mg | ORAL_TABLET | Freq: Every day | ORAL | Status: DC
  Administered 2018-04-25 – 2018-04-27 (×3): 20 mg via ORAL
  Filled 2018-04-25 (×3): qty 1

## 2018-04-25 MED ORDER — BISACODYL 5 MG PO TBEC: 5.0000 mg | DELAYED_RELEASE_TABLET | Freq: Every day | ORAL | Status: DC | PRN

## 2018-04-25 MED ORDER — HYDROCHLOROTHIAZIDE 25 MG PO TABS
25.0000 mg | ORAL_TABLET | Freq: Every day | ORAL | Status: DC
  Administered 2018-04-25 – 2018-04-26 (×2): 25 mg via ORAL
  Filled 2018-04-25 (×2): qty 1

## 2018-04-25 MED ORDER — ONDANSETRON HCL 4 MG/2ML IJ SOLN
4.0000 mg | Freq: Four times a day (QID) | INTRAMUSCULAR | Status: DC | PRN
  Administered 2018-04-26: 4 mg via INTRAVENOUS
  Filled 2018-04-25: qty 2

## 2018-04-25 MED ORDER — CHLORHEXIDINE GLUCONATE 0.12 % MT SOLN
15.0000 mL | Freq: Two times a day (BID) | OROMUCOSAL | Status: DC
  Administered 2018-04-25 – 2018-04-27 (×4): 15 mL via OROMUCOSAL
  Filled 2018-04-25 (×4): qty 15

## 2018-04-25 MED ORDER — ORAL CARE MOUTH RINSE
15.0000 mL | Freq: Two times a day (BID) | OROMUCOSAL | Status: DC
  Administered 2018-04-25 – 2018-04-28 (×4): 15 mL via OROMUCOSAL

## 2018-04-25 MED ORDER — FUROSEMIDE 10 MG/ML IJ SOLN
40.0000 mg | Freq: Once | INTRAMUSCULAR | Status: DC
  Filled 2018-04-25: qty 4

## 2018-04-25 MED ORDER — ACETAMINOPHEN 650 MG RE SUPP: 650.0000 mg | Freq: Four times a day (QID) | RECTAL | Status: DC | PRN

## 2018-04-25 MED ORDER — IPRATROPIUM-ALBUTEROL 0.5-2.5 (3) MG/3ML IN SOLN
3.0000 mL | Freq: Once | RESPIRATORY_TRACT | Status: AC
Start: 2018-04-25 — End: 2018-04-25
  Administered 2018-04-25: 3 mL via RESPIRATORY_TRACT
  Filled 2018-04-25: qty 3

## 2018-04-25 MED ORDER — ACETAZOLAMIDE SODIUM 500 MG IJ SOLR
500.0000 mg | Freq: Once | INTRAMUSCULAR | Status: AC
  Administered 2018-04-25: 500 mg via INTRAVENOUS
  Filled 2018-04-25: qty 500

## 2018-04-25 MED ORDER — GERHARDT'S BUTT CREAM
TOPICAL_CREAM | Freq: Three times a day (TID) | CUTANEOUS | Status: DC
  Administered 2018-04-27 – 2018-04-28 (×3): via TOPICAL
  Filled 2018-04-25: qty 1

## 2018-04-25 MED ORDER — METHYLPREDNISOLONE SODIUM SUCC 125 MG IJ SOLR
125.0000 mg | Freq: Once | INTRAMUSCULAR | Status: AC
  Administered 2018-04-25: 125 mg via INTRAVENOUS
  Filled 2018-04-25: qty 2

## 2018-04-25 MED ORDER — METHYLPREDNISOLONE SODIUM SUCC 125 MG IJ SOLR
60.0000 mg | Freq: Two times a day (BID) | INTRAMUSCULAR | Status: AC
Start: 2018-04-25 — End: 2018-04-25
  Administered 2018-04-25 (×2): 60 mg via INTRAVENOUS
  Filled 2018-04-25 (×2): qty 2

## 2018-04-25 MED ORDER — LISINOPRIL-HYDROCHLOROTHIAZIDE 20-25 MG PO TABS: 1.0000 | ORAL_TABLET | Freq: Every day | ORAL | Status: DC

## 2018-04-25 MED ORDER — IPRATROPIUM-ALBUTEROL 0.5-2.5 (3) MG/3ML IN SOLN
3.0000 mL | Freq: Four times a day (QID) | RESPIRATORY_TRACT | Status: DC
  Administered 2018-04-25 – 2018-04-28 (×12): 3 mL via RESPIRATORY_TRACT
  Filled 2018-04-25 (×12): qty 3

## 2018-04-25 MED ORDER — STERILE WATER FOR INJECTION IJ SOLN
INTRAMUSCULAR | Status: AC
Start: 2018-04-25 — End: 2018-04-25
  Administered 2018-04-25: 5 mL
  Filled 2018-04-25: qty 10

## 2018-04-25 MED ORDER — SODIUM CHLORIDE 0.9 % IV SOLN
500.0000 mg | INTRAVENOUS | Status: DC
  Administered 2018-04-25 – 2018-04-26 (×2): 500 mg via INTRAVENOUS
  Filled 2018-04-25 (×2): qty 500

## 2018-04-25 MED ORDER — PREDNISONE 20 MG PO TABS
40.0000 mg | ORAL_TABLET | Freq: Every day | ORAL | Status: DC
  Administered 2018-04-26 – 2018-04-28 (×3): 40 mg via ORAL
  Filled 2018-04-25 (×3): qty 2

## 2018-04-25 MED ORDER — LISINOPRIL 20 MG PO TABS
20.0000 mg | ORAL_TABLET | Freq: Every day | ORAL | Status: DC
  Administered 2018-04-25 – 2018-04-26 (×2): 20 mg via ORAL
  Filled 2018-04-25 (×2): qty 1

## 2018-04-25 MED ORDER — ONDANSETRON HCL 4 MG PO TABS: 4.0000 mg | ORAL_TABLET | Freq: Four times a day (QID) | ORAL | Status: DC | PRN

## 2018-04-25 NOTE — H&P (Signed)
[facility] [place] [facility]   PATIENT NAME: Kyle Frederick    MR#:  [id number]  DATE OF BIRTH:  01-25-1931  DATE OF ADMISSION:  04/25/2018  PRIMARY CARE PHYSICIAN: Gracelyn Nurse, MD   REQUESTING/REFERRING PHYSICIAN: Loleta Rose, MD  CHIEF COMPLAINT:   Chief Complaint  Patient presents with  . Shortness of Breath    HISTORY OF PRESENT ILLNESS:  Floyd Wade  is a 84 y.o. male with a known history of COPD/ILD (2L [place] chronic O2) p/w 1d Hx SOB. Pt is on BiPAP. Hx from son + stepdaughter (DPOA) at bedside. Pt called son @~2315PM c/o SOB. Son went to pt's house, found pt to be in acute distress, called EMS. (+) cough, (+) conversational dyspnea. SOB apparently became so severe pt was unable to speak. Reportedly confused, though I am unclear as to how confusion was assessed in someone who was non-verbal. Started on CPAP by EMS. Noted to have retractions on ED arrival, started on BiPAP w/ improved WoB. VBG PCO2 81. Hard of hearing at baseline. Denies CP. Not in distress, wearing BiPAP. Lung sounds diffusely diminished, however (-) wheezing on exam at the time of my assessment. (+) B/L LE edema, chronic x27mo+. CXR (-) consolidation. 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  PAST MEDICAL HISTORY:   Past Medical History:  Diagnosis Date  . CHF (congestive heart failure) (HCC)   . COPD (chronic obstructive pulmonary disease) (HCC)     PAST SURGICAL HISTORY:  History reviewed. No pertinent surgical history.  SOCIAL HISTORY:   Social History   Tobacco Use  . Smoking status: Former Games developer  . Smokeless tobacco: Never Used  Substance Use Topics  . Alcohol use: Not on file    FAMILY HISTORY:  History reviewed. No pertinent family history.  DRUG ALLERGIES:   Allergies  Allergen Reactions  . Lasix [Furosemide] Other (See Comments)    "syncope"    REVIEW OF SYSTEMS:     Review of Systems  Constitutional: Negative for chills, diaphoresis, fever, malaise/fatigue and weight loss.  HENT: Negative for congestion, ear pain, hearing loss, nosebleeds, sinus pain, sore throat and tinnitus.   Eyes: Negative for blurred vision, double vision and photophobia.  Respiratory: Positive for cough and shortness of breath. Negative for hemoptysis, sputum production and wheezing.   Cardiovascular: Negative for chest pain, palpitations, orthopnea, claudication, leg swelling and PND.  Gastrointestinal: Negative for abdominal pain, blood in stool, constipation, diarrhea, heartburn, melena, nausea and vomiting.  Genitourinary: Negative for dysuria, frequency, hematuria and urgency.  Musculoskeletal: Negative for back pain, joint pain, myalgias and neck pain.  Skin: Negative for itching and rash.  Neurological: Negative for dizziness, tingling, tremors, sensory change, speech change, focal weakness, seizures, loss of consciousness, weakness and headaches.  Psychiatric/Behavioral: Negative for memory loss. The patient does not have insomnia.    MEDICATIONS AT HOME:   Prior to Admission medications   Medication Sig Start Date End Date Taking? Authorizing Provider  lisinopril-hydrochlorothiazide (PRINZIDE,ZESTORETIC) 20-25 MG tablet Take 1 tablet by mouth daily. 02/09/18  Yes [provider]  simvastatin (ZOCOR) 20 MG tablet Take 1 tablet by mouth daily. 02/09/18   [provider]      VITAL SIGNS:  Blood pressure (!) 166/66, pulse (!) 53, temperature 98 F (36.7 C), temperature source Oral, resp. rate (!) 24, height  (1.753 m), weight 127 kg (280 lb), SpO2 100 %.  PHYSICAL EXAMINATION:  Physical Exam  Constitutional: He appears well-developed and well-nourished. He is active and cooperative.  Non-toxic appearance. He does not have a sickly appearance. He does not appear ill. No distress. He is not intubated. Face mask in place.  HENT:  Head:  Normocephalic and atraumatic.  Eyes: Conjunctivae, EOM and lids are normal. No scleral icterus.  Neck: Neck supple. No JVD present. No thyromegaly present.  Cardiovascular: Normal rate, regular rhythm, S1 normal and S2 normal.  No extrasystoles are present. Exam reveals distant heart sounds. Exam reveals no gallop, no S3, no S4 and no friction rub.  No murmur heard. Pulmonary/Chest: Effort normal. No accessory muscle usage or stridor. Tachypnea noted. No apnea and no bradypnea. He is not intubated. No respiratory distress. He has decreased breath sounds in the right upper field, the right middle field, the right lower field, the left upper field, the left middle field and the left lower field. He has no wheezes. He has no rhonchi. He has no rales.  Abdominal: Soft. Bowel sounds are normal. He exhibits no distension. There is no tenderness. There is no rebound and no guarding.  Musculoskeletal: He exhibits edema.       Right lower leg: He exhibits edema.       Left lower leg: He exhibits edema.  Lymphadenopathy:    He has no cervical adenopathy.  Neurological: He is alert.  Skin: Skin is warm, dry and intact. No rash noted. He is not diaphoretic. No erythema.  Psychiatric: He has a normal mood and affect. His behavior is normal. Judgment normal. His mood appears not anxious. He is not agitated.   LABORATORY PANEL:   CBC Recent Labs  Lab 04/25/18 0028  WBC 6.3  HGB 9.8*  HCT 31.1*  PLT 185   ------------------------------------------------------------------------------------------------------------------  Chemistries  Recent Labs  Lab 04/25/18 0028  NA 140  K 4.9  CL 98*  CO2 37*  GLUCOSE 97  BUN 33*  CREATININE 1.44*  CALCIUM 9.5  AST 18  ALT 10*  ALKPHOS 49  BILITOT 0.4   ------------------------------------------------------------------------------------------------------------------  Cardiac Enzymes Recent Labs  Lab 04/25/18 0028  TROPONINI <0.03    ------------------------------------------------------------------------------------------------------------------  RADIOLOGY:  Dg Chest Portable 1 View  Result Date: 04/25/2018 CLINICAL DATA:  Acute onset short of breath EXAM: PORTABLE CHEST 1 VIEW COMPARISON:  10/28/2012 FINDINGS: Cardiomegaly. Coarse left greater than right interstitial opacity, suspect chronic change. No acute consolidation or effusion. Aortic atherosclerosis. No pneumothorax. IMPRESSION: Cardiomegaly. Increased interstitial opacity, similar appearance to prior and suggestive of chronic interstitial disease. Atelectasis or scar at the left base. Electronically Signed   By: Jasmine Pang M.D.   On: 04/25/2018 00:59   IMPRESSION AND PLAN:   A/P: 45M acute on chronic hypoxemic hypercapnoeic respiratory failure, acute on chronic ILD/COPD exacerbation, Cr elevation/CKD III, normocytic anemia, chronic B/L LE edema. -Admit to Stepdown, BiPAP -Tele, continuous cardiac monitoring + pulse oximetry -DuoNebs -IV steroids, transition to PO -Moderate to severe exacerbation, ABx indicated even in absence of active pulmonary infxn, Azithromycin -Incentive spirometry -Pulmonary toileting -ILD, may benefit from outpt Pulmonology -Cr 1.4, at baseline compared to 10/2017 labs, CKD III -Normocytic anemia likely anemia of chronic disease, no evidence of acute blood loss -B/L LE edema chronic x81mo+ (worse since Christmas per family). Unable to find Echo results, edema may be due to lung disease w/ PHTN, effective R-sided heart failure. May benefit from outpt Vascular Roland Rack boot, compression stockings) or lymphedema clinic f/up -c/w home meds -Cardiac diet -Lovenox -Full code -Admission, >  2 midnights   All the records are reviewed and case discussed with ED provider. Management plans discussed with the patient, family and they are in agreement.  CODE STATUS: Full code  TOTAL TIME TAKING CARE OF THIS PATIENT: 90 minutes.     Barbaraann Rondo M.D on 04/25/2018 at 2:03 AM  Between 7am to 6pm - Pager - [phone]  After 6pm go to [email or web address] - Social research officer, government  [facility]  Office  [phone]  CC: Primary care physician; Gracelyn Nurse, MD   Note: This dictation was prepared with Dragon dictation along with smaller phrase technology. Any transcriptional errors that result from this process are unintentional.

## 2018-04-25 NOTE — Progress Notes (Signed)
*  PRELIMINARY RESULTS* Echocardiogram 2D Echocardiogram has been performed.  Kyle Frederick Lileigh Fahringer 04/25/2018, 3:14 PM

## 2018-04-25 NOTE — ED Notes (Signed)
RT and RN transporting, CCU called

## 2018-04-25 NOTE — ED Notes (Signed)
Pt confused about recent events, unable to report the president of [place] or easily answer 18 + 3, pt unable to report hx of medical conditions or meds taken  Medication list provided by daughter in law, pt lives alone and "sets out" his own medications, family cautioned about new confusion versus diminished ability to conduct ADLs

## 2018-04-25 NOTE — ED Notes (Signed)
Report finished att, establishing second IV prior to transport

## 2018-04-25 NOTE — Progress Notes (Addendum)
Family Meeting Note  Advance Directive:no  Today a meeting took place with the Patient.and family   The following clinical team members were present during this meeting:MD  The following were discussed:Patient's diagnosis: Acute on chronic hypoxic respiratory failure COPD exacerbation Lower extremity edema History of ILD Hx cva with baseline confusion at times , Patient's progosis: Unable to determine and Goals for treatment: DNR  Additional follow-up to be provided: POA has advance directives no update needed  Time spent during discussion: 16 minutes  Kyle Calabria, MD

## 2018-04-25 NOTE — Progress Notes (Addendum)
Patient alert and oriented to self. Progressed today from BIPAP to baseline 2L nasal cannula with vital signs stable. Voiding in urinal, reporting no pain. Redness to sacrum evaluated by [facility] nurse and recommendations/orders placed, awaiting medication cream from pharmacy. Transfer to 2A order placed. Family at bedside intermittently today, notified of transfer to room 258 via telephone. Called to give report to 2A, awaiting call back from RN.

## 2018-04-25 NOTE — ED Triage Notes (Signed)
Patient presents to Emergency Department via EMS with complaints of SOB from home EMS unable to get a pulse ox, pt placed on NRB ETCO2 40.   Pt on O2 2lpm [place]

## 2018-04-25 NOTE — ED Provider Notes (Signed)
[facility] Emergency Department Provider Note  ____________________________________________   First MD Initiated Contact with Patient 04/25/18 0024     (approximate)  I have reviewed the triage vital signs and the nursing notes.   HISTORY  Chief Complaint Shortness of Breath  Level 5 caveat:  history/ROS limited by acute/critical illness  HPI Kyle Frederick is a 84 y.o. male with medical history as listed below who presents by EMS for evaluation of acute shortness of breath.  He is a little bit confused and in some respiratory distress is not able to give a thorough history, but he reports that his shortness of breath started today and has been gradually getting worse.  His shortness of breath was severe and he does use 2 L of oxygen at home but it was not helping.  EMS reported increased work of breathing and retractions and initially started him on CPAP, but their end-tidal CO2 readings were very low so they stopped doing CPAP.  When he arrived he was still having some mild retractions and still seemed confused but was not in as much acute distress although he was on 4 L of O2 upon arrival.  He denies chest pain, abdominal pain, nausea, and vomiting.  He does report severe shortness of breath although it is improved now.  Of note, he has significant peripheral edema in both legs, but he says they are like that all the time.  Past Medical History:  Diagnosis Date  . CHF (congestive heart failure) (HCC)   . COPD (chronic obstructive pulmonary disease) [facility])     Patient Active Problem List   Diagnosis Date Noted  . Acute exacerbation of chronic obstructive pulmonary disease (COPD) (HCC) 04/25/2018    History reviewed. No pertinent surgical history.  Prior to Admission medications   Medication Sig Start Date End Date Taking? Authorizing Provider  lisinopril-hydrochlorothiazide (PRINZIDE,ZESTORETIC) 20-25 MG tablet Take 1 tablet by mouth daily. 02/09/18   Yes [provider]  simvastatin (ZOCOR) 20 MG tablet Take 1 tablet by mouth daily. 02/09/18   [provider]    Allergies Lasix [furosemide]  History reviewed. No pertinent family history.  Social History Social History   Tobacco Use  . Smoking status: Former Games developer  . Smokeless tobacco: Never Used  Substance Use Topics  . Alcohol use: Not on file  . Drug use: Not on file    Review of Systems Insert level 5 caveat, see HPI for details ____________________________________________   PHYSICAL EXAM:  VITAL SIGNS: ED Triage Vitals  Enc Vitals Group     BP 04/25/18 0024 (!) 173/71     Pulse Rate 04/25/18 0024 62     Resp 04/25/18 0024 20     Temp 04/25/18 0024 98 F (36.7 C)     Temp Source 04/25/18 0024 Oral     SpO2 04/25/18 0024 98 %     Weight 04/25/18 0027 127 kg (280 lb)     Height 04/25/18 0027 1.753 m ( )     Head Circumference --      Peak Flow --      Pain Score 04/25/18 0027 0     Pain Loc --      Pain Edu? --      Excl. in GC? --     Constitutional: Alert and oriented to person and location but confused and slow to respond to questions Eyes: Conjunctivae are normal.  Head: Atraumatic. Nose: No congestion/rhinnorhea. Mouth/Throat: Mucous membranes are moist. Neck:  No stridor.  No meningeal signs.   Cardiovascular: Normal rate, regular rhythm. Good peripheral circulation. Grossly normal heart sounds. Respiratory: Some coarse breath sounds throughout, decreased air movement, mild expiratory wheezing, intercostal muscle retractions and increased respiratory rate Gastrointestinal: Soft and nontender. No distention.  Musculoskeletal: 2+ bilateral peripheral edema in the lower legs which patient says is chronic Neurologic:  Normal speech and language. No gross focal neurologic deficits are appreciated.  Skin:  Skin is warm, dry and intact. No rash noted.   ____________________________________________   LABS (all labs ordered are  listed, but only abnormal results are displayed)  Labs Reviewed  CBC WITH DIFFERENTIAL/PLATELET - Abnormal; Notable for the following components:      Result Value   RBC 3.78 (*)    Hemoglobin 9.8 (*)    HCT 31.1 (*)    MCH 25.9 (*)    MCHC 31.5 (*)    RDW 14.7 (*)    All other components within normal limits  COMPREHENSIVE METABOLIC PANEL - Abnormal; Notable for the following components:   Chloride 98 (*)    CO2 37 (*)    BUN 33 (*)    Creatinine, Ser 1.44 (*)    ALT 10 (*)    GFR calc non Af Amer 43 (*)    GFR calc Af Amer 50 (*)    All other components within normal limits  BLOOD GAS, VENOUS - Abnormal; Notable for the following components:   pCO2, Ven 81 (*)    Bicarbonate 43.7 (*)    Acid-Base Excess 15.0 (*)    All other components within normal limits  LACTIC ACID, PLASMA  TROPONIN I  BRAIN NATRIURETIC PEPTIDE  LACTIC ACID, PLASMA   ____________________________________________  EKG  ED ECG REPORT I, Loleta Rose, the attending physician, personally viewed and interpreted this ECG.  Date: 04/25/2018 EKG Time: 00:35 Rate: 61 Rhythm: sinus rhythm QRS Axis: normal Intervals: normal ST/T Wave abnormalities: Non-specific ST segment / T-wave changes, but no evidence of acute ischemia. Narrative Interpretation: no evidence of acute ischemia   ____________________________________________  RADIOLOGY I, Loleta Rose, personally viewed and evaluated these images (plain radiographs) as part of my medical decision making, as well as reviewing the written report by the radiologist.  ED MD interpretation: No evidence of acute pneumonia  Official radiology report(s): Dg Chest Portable 1 View  Result Date: 04/25/2018 CLINICAL DATA:  Acute onset short of breath EXAM: PORTABLE CHEST 1 VIEW COMPARISON:  10/28/2012 FINDINGS: Cardiomegaly. Coarse left greater than right interstitial opacity, suspect chronic change. No acute consolidation or effusion. Aortic atherosclerosis.  No pneumothorax. IMPRESSION: Cardiomegaly. Increased interstitial opacity, similar appearance to prior and suggestive of chronic interstitial disease. Atelectasis or scar at the left base. Electronically Signed   By: Jasmine Pang M.D.   On: 04/25/2018 00:59    ____________________________________________   PROCEDURES  Critical Care performed: Yes, see critical care procedure note(s)   Procedure(s) performed:   .Critical Care Performed by: Loleta Rose, MD Authorized by: Loleta Rose, MD   Critical care provider statement:    Critical care time (minutes):  30   Critical care time was exclusive of:  Separately billable procedures and treating other patients   Critical care was necessary to treat or prevent imminent or life-threatening deterioration of the following conditions:  Respiratory failure   Critical care was time spent personally by me on the following activities:  Development of treatment plan with patient or surrogate, discussions with consultants, evaluation of patient's response to treatment, examination of  patient, obtaining history from patient or surrogate, ordering and performing treatments and interventions, ordering and review of laboratory studies, ordering and review of radiographic studies, pulse oximetry, re-evaluation of patient's condition and review of old charts     ____________________________________________   INITIAL IMPRESSION / ASSESSMENT AND PLAN / ED COURSE  As part of my medical decision making, I reviewed the following data within the electronic MEDICAL RECORD NUMBER History obtained from family, Nursing notes reviewed and incorporated, Labs reviewed , EKG interpreted , Old chart reviewed, Radiograph reviewed , Discussed with admitting physician  and Notes from prior ED visits    Differential diagnosis includes, but is not limited to, COPD exacerbation, CHF exacerbation, ACS, pneumonia, PE.  The patient is confused upon arrival consistent with  hypercapnia.  I ordered a VBG which demonstrated a PCO2 of 81 so I initiated BiPAP treatment.  His work-up is generally reassuring with labs that are within normal limits other than the VBG.  I believe he is a CO2 retainer and is having an acute COPD exacerbation.  There is no evidence of acute CHF exacerbation or ACS.  After about 30 minutes on BiPAP he was feeling significantly better but I will leave the BiPAP in place given the significant hypercapnia and the confusion associated with it.  I discussed the case with the hospitalist who will admit.  The patient is also receiving duo nebs x3 and Solu-Medrol 125 mg IV.  I evaluated him just prior to finishing this note and he gave me to thumbs up and said he is feeling better.  Family was updated at bedside.     ____________________________________________  FINAL CLINICAL IMPRESSION(S) / ED DIAGNOSES  Final diagnoses:  Acute respiratory failure with hypercapnia (HCC)  COPD exacerbation (HCC)     MEDICATIONS GIVEN DURING THIS VISIT:  Medications  ipratropium-albuterol (DUONEB) 0.5-2.5 (3) MG/3ML nebulizer solution 3 mL (3 mLs Nebulization Given 04/25/18 0142)  ipratropium-albuterol (DUONEB) 0.5-2.5 (3) MG/3ML nebulizer solution 3 mL (3 mLs Nebulization Given 04/25/18 0142)  ipratropium-albuterol (DUONEB) 0.5-2.5 (3) MG/3ML nebulizer solution 3 mL (3 mLs Nebulization Given 04/25/18 0141)  methylPREDNISolone sodium succinate (SOLU-MEDROL) 125 mg/2 mL injection 125 mg (125 mg Intravenous Given 04/25/18 0130)     ED Discharge Orders    None       Note:  This document was prepared using Dragon voice recognition software and may include unintentional dictation errors.    Loleta Rose, MD 04/25/18 [id number]

## 2018-04-25 NOTE — Consult Note (Signed)
PULMONARY / CRITICAL CARE MEDICINE   Name: Kyle Frederick MRN: [id number] DOB: 1931-03-03    ADMISSION DATE:  04/25/2018   CONSULTATION DATE: 04/25/2018  REFERRING MD: Lanny Hurst  Reason: Acute hypoxic respiratory failure requiring BiPAP  HISTORY OF PRESENT ILLNESS:   This is an 84 y/o male who presented to the ED with complaints of shortness of breath that started yesterday.  Patient is on home O2 at 2 L nasal cannula.  When EMS arrived, patient had increased work of breathing and was hypoxic hence he was placed on CPAP.  At the ED, patient was confused, was found to have profound bilateral lower extremity edema and interstitial lung disease on chest x-ray.  His venous blood gas showed moderate hypercarbia.  He is being admitted to the ICU for further management  PAST MEDICAL HISTORY :  He  has a past medical history of CHF (congestive heart failure) (HCC) and COPD (chronic obstructive pulmonary disease) (HCC).  PAST SURGICAL HISTORY: He  has no past surgical history on file.  Allergies  Allergen Reactions  . Lasix [Furosemide] Other (See Comments)    "syncope"    No current facility-administered medications on file prior to encounter.    Current Outpatient Medications on File Prior to Encounter  Medication Sig  . acetaminophen (TYLENOL) 650 MG CR tablet Take 2 tablets by mouth every 8 (eight) hours as needed.  Marland Kitchen aspirin 81 MG EC tablet Take 1 tablet by mouth daily.   Marland Kitchen lisinopril-hydrochlorothiazide (PRINZIDE,ZESTORETIC) 20-25 MG tablet Take 1 tablet by mouth daily.  . polyethylene glycol (MIRALAX / GLYCOLAX) packet Take 1 packet by mouth daily as needed.  . simvastatin (ZOCOR) 20 MG tablet Take 1 tablet by mouth daily.    FAMILY HISTORY:  His has no family status information on file.    SOCIAL HISTORY: He  reports that he has quit smoking. He has never used smokeless tobacco.  REVIEW OF SYSTEMS:   Unable to obtain as patient is on continuous BiPAP  SUBJECTIVE:     VITAL SIGNS: BP (!) 148/56 (BP Location: Left Arm)   Pulse (!) 50   Temp 98.1 F (36.7 C) (Axillary)   Resp 17   Ht  (1.753 m)   Wt 280 lb (127 kg)   SpO2 100%   BMI 41.35 kg/m   HEMODYNAMICS:    VENTILATOR SETTINGS: FiO2 (%):  [30 %] 30 %  INTAKE / OUTPUT: I/O last 3 completed shifts: In: 250 [IV Piggyback:250] Out: 50 [Urine:50]  PHYSICAL EXAMINATION: General: Appears unkempt, in moderate respiratory distress Neuro: Alert to person only, confused, moves all extremities, follows some basic commands HEENT: PERRLA, oral mucosa moist, mild JVD Cardiovascular: Apical pulse regular, S1-S2, no murmur regurg or gallop, +2 pulses bilaterally, +4 pitting edema bilaterally Lungs: Bilateral breath sounds, diminished in the bases, no wheezing or rhonchi Abdomen: Obese, normal bowel sounds in all 4 quadrants, palpation reveals no organomegaly Musculoskeletal: No joint deformities, positive range of motion in upper and lower extremities Skin: Warm and dry  LABS:  BMET Recent Labs  Lab 04/25/18 0028  NA 140  K 4.9  CL 98*  CO2 37*  BUN 33*  CREATININE 1.44*  GLUCOSE 97    Electrolytes Recent Labs  Lab 04/25/18 0028  CALCIUM 9.5    CBC Recent Labs  Lab 04/25/18 0028  WBC 6.3  HGB 9.8*  HCT 31.1*  PLT 185    Coag's No results for input(s): APTT, INR in the last 168 hours.  Sepsis Markers Recent Labs  Lab 04/25/18 0028  LATICACIDVEN 0.9    ABG No results for input(s): PHART, PCO2ART, PO2ART in the last 168 hours.  Liver Enzymes Recent Labs  Lab 04/25/18 0028  AST 18  ALT 10*  ALKPHOS 49  BILITOT 0.4  ALBUMIN 4.0    Cardiac Enzymes Recent Labs  Lab 04/25/18 0028  TROPONINI <0.03    Glucose Recent Labs  Lab 04/25/18 0322  GLUCAP 88    Imaging Dg Chest Portable 1 View  Result Date: 04/25/2018 CLINICAL DATA:  Acute onset short of breath EXAM: PORTABLE CHEST 1 VIEW COMPARISON:  10/28/2012 FINDINGS: Cardiomegaly. Coarse  left greater than right interstitial opacity, suspect chronic change. No acute consolidation or effusion. Aortic atherosclerosis. No pneumothorax. IMPRESSION: Cardiomegaly. Increased interstitial opacity, similar appearance to prior and suggestive of chronic interstitial disease. Atelectasis or scar at the left base. Electronically Signed   By: Jasmine Pang M.D.   On: 04/25/2018 00:59   STUDIES:  2D echo  SIGNIFICANT EVENTS: 05/26>admitted  LINES/TUBES: PIVs  DISCUSSION: 84 year old male presenting with acute hypoxic and hypercarbic respiratory failure, interstitial lung disease and bilateral lower extremity edema  ASSESSMENT Acute hypoxic/hypercarbic respiratory failure Severe lower extremity edema Acute on chronic COPD exacerbation Interstitial lung disease Acute kidney injury   PLAN Continuous BiPAP and titrate nasal cannula as tolerated 2D echo Nebulized bronchodilators Systemic steroids Empiric antibiotics Resume all home medications Gentle diuresis Monitor and correct electrolyte imbalance GI and DVT prophylaxis  FAMILY  - Updates: no family at bedside.  Will update when available Lela Gell S. [facility] ANP-BC Pulmonary and Critical Care Medicine [facility] Pager [phone] or [phone]  NB: This document was prepared using Dragon voice recognition software and may include unintentional dictation errors.    04/25/2018, 8:21 AM

## 2018-04-25 NOTE — Progress Notes (Signed)
Called Erin in ICU for report

## 2018-04-25 NOTE — Progress Notes (Signed)
   04/25/18 1600  Clinical Encounter Type  Visited With Patient  Visit Type Initial (order for advanced directive)  Referral From Physician  Consult/Referral To Chaplain   Chaplain responded to order regarding advanced directive.  Patient expressed that he had no interest in education or completion of document.  Chaplain reviewed chaplain services and encouraged patient to have staff page as needed.

## 2018-04-25 NOTE — Progress Notes (Signed)
[facility] - [place] [facility]   PATIENT NAME: Kyle Frederick    MR#:  [id number]  DATE OF BIRTH:  [birth year]  SUBJECTIVE:   Patient admitted this morning on BiPAP due to shortness of breath  REVIEW OF SYSTEMS:    Review of Systems  Constitutional: Negative for fever, chills weight loss HENT: Negative for ear pain, nosebleeds, congestion, facial swelling, rhinorrhea, neck pain, neck stiffness and ear discharge.   Respiratory: Positive cough, shortness of breath, wheezing  Cardiovascular: Negative for chest pain, palpitations and ++leg swelling.  Gastrointestinal: Negative for heartburn, abdominal pain, vomiting, diarrhea or consitpation Genitourinary: Negative for dysuria, urgency, frequency, hematuria Musculoskeletal: Negative for back pain or joint pain Neurological: Negative for dizziness, seizures, syncope, focal weakness,  numbness and headaches.  Hematological: Does not bruise/bleed easily.  Psychiatric/Behavioral: Negative for hallucinations, confusion, dysphoric mood    Tolerating Diet: yes      DRUG ALLERGIES:   Allergies  Allergen Reactions  . Lasix [Furosemide] Other (See Comments)    "syncope"    VITALS:  Blood pressure 131/74, pulse 70, temperature 98.1 F (36.7 C), temperature source Axillary, resp. rate (!) 21, height  (1.753 m), weight 127 kg (280 lb), SpO2 97 %.  PHYSICAL EXAMINATION:  Constitutional: Appears well-developed and well-nourished. No distress. HENT: Normocephalic. Marland Kitchen Oropharynx is clear and moist.  Eyes: Conjunctivae and EOM are normal. PERRLA, no scleral icterus.  Neck: Normal ROM. Neck supple. No JVD. No tracheal deviation. CVS: RRR, S1/S2 +,= murmurs, no gallops, no carotid bruit.  Pulmonary: on bipap with diminished breath sounds bilaterally no wheezing or rhonchi  abdominal: Soft. BS +,  no distension, tenderness, rebound or guarding.  Musculoskeletal: Normal range of motion. 4++ LEE and no tenderness.   Neuro: Alert. CN 2-12 grossly intact. No focal deficits. Skin: Skin is warm and dry. No rash noted. Psychiatric: Confused oriented to name and place not time    LABORATORY PANEL:   CBC Recent Labs  Lab 04/25/18 0028  WBC 6.3  HGB 9.8*  HCT 31.1*  PLT 185   ------------------------------------------------------------------------------------------------------------------  Chemistries  Recent Labs  Lab 04/25/18 0028  NA 140  K 4.9  CL 98*  CO2 37*  GLUCOSE 97  BUN 33*  CREATININE 1.44*  CALCIUM 9.5  AST 18  ALT 10*  ALKPHOS 49  BILITOT 0.4   ------------------------------------------------------------------------------------------------------------------  Cardiac Enzymes Recent Labs  Lab 04/25/18 0028  TROPONINI <0.03   ------------------------------------------------------------------------------------------------------------------  RADIOLOGY:  Dg Chest Portable 1 View  Result Date: 04/25/2018 CLINICAL DATA:  Acute onset short of breath EXAM: PORTABLE CHEST 1 VIEW COMPARISON:  10/28/2012 FINDINGS: Cardiomegaly. Coarse left greater than right interstitial opacity, suspect chronic change. No acute consolidation or effusion. Aortic atherosclerosis. No pneumothorax. IMPRESSION: Cardiomegaly. Increased interstitial opacity, similar appearance to prior and suggestive of chronic interstitial disease. Atelectasis or scar at the left base. Electronically Signed   By: Jasmine Pang M.D.   On: 04/25/2018 00:59     ASSESSMENT AND PLAN:   84 year old male with history of cardiomegaly and COPD who presents with cough and shortness of breath   1.  Acute hypoxic and hypercarbic respiratory failure with history of interstitial lung disease and COPD Wean BiPAP as tolerated to nasal cannula  2.  Acute exacerbation of COPD: Continue bronchodilators, IV steroids Continue azithromycin for acute bronchitis resulting in acute exacerbation of COPD 3.  Lower extremity edema:  Order echocardiogram to evaluate EF and heart valve Received Diamox and 40 IV Lasix in the emergency  room  4.  Essential hypertension: Continue lisinopril and HCTZ  5.  Hyperlipidemia: Continue statin  6.  History of interstitial lung disease   Management plans discussed with the patient and family and they are in agreement.  CODE STATUS: FULL  TOTAL TIME TAKING CARE OF THIS PATIENT: 30 minutes.     POSSIBLE D/C 2 days, DEPENDING ON CLINICAL CONDITION.   Dodi Leu M.D on 04/25/2018 at 11:29 AM  Between 7am to 6pm - Pager - [phone] After 6pm go to [email or web address] - password [employer]  [facility]  Office  [phone]  CC: Primary care physician; Gracelyn Nurse, MD  Note: This dictation was prepared with Dragon dictation along with smaller phrase technology. Any transcriptional errors that result from this process are unintentional.

## 2018-04-25 NOTE — Consult Note (Signed)
WOC Nurse wound consult note Reason for Consult:Moisture  Associated skin damage, specifically incontinence associated dermatitis Wound type:Moisture Pressure Injury POA: N/A Measurement: Erythematous area with maceration measures 5cm x 2cm with no depth Wound ZOX:WRUE Drainage (amount, consistency, odor) None Periwound: clear, dry Dressing procedure/placement/frequency: There is a sacral dressing over the intact sacrum. We will implement three times daily application of Gerhart's Butt Cream to the affected areas and encourage side lying positioning. One underpad is recommended so that patient can benefit form the low air loss feature of the ICU bed.  WOC nursing team will not follow, but will remain available to this patient, the nursing and medical teams.  Please re-consult if needed. Thanks, Ladona Mow, MSN, RN, GNP, [place]  Pager# [phone]

## 2018-04-26 LAB — ECHOCARDIOGRAM COMPLETE
HEIGHTINCHES: 69 in
Weight: 4480 oz

## 2018-04-26 LAB — RENAL FUNCTION PANEL
ALBUMIN: 4 g/dL (ref 3.5–5.0)
ANION GAP: 9 (ref 5–15)
BUN: 40 mg/dL — ABNORMAL HIGH (ref 6–20)
CALCIUM: 9.5 mg/dL (ref 8.9–10.3)
CO2: 30 mmol/L (ref 22–32)
Chloride: 99 mmol/L — ABNORMAL LOW (ref 101–111)
Creatinine, Ser: 1.5 mg/dL — ABNORMAL HIGH (ref 0.61–1.24)
GFR calc non Af Amer: 41 mL/min — ABNORMAL LOW (ref >60)
GFR, EST AFRICAN AMERICAN: 48 mL/min — AB (ref >60)
GLUCOSE: 145 mg/dL — AB (ref 65–99)
PHOSPHORUS: 2.6 mg/dL (ref 2.5–4.6)
Potassium: 4.7 mmol/L (ref 3.5–5.1)
SODIUM: 138 mmol/L (ref 135–145)

## 2018-04-26 LAB — BRAIN NATRIURETIC PEPTIDE: B Natriuretic Peptide: 78 pg/mL (ref 0.0–100.0)

## 2018-04-26 MED ORDER — ASPIRIN 325 MG PO TABS
325.0000 mg | ORAL_TABLET | Freq: Every day | ORAL | Status: DC
  Administered 2018-04-26 – 2018-04-28 (×3): 325 mg via ORAL
  Filled 2018-04-26 (×3): qty 1

## 2018-04-26 MED ORDER — AZITHROMYCIN 250 MG PO TABS
500.0000 mg | ORAL_TABLET | Freq: Every day | ORAL | Status: AC
  Administered 2018-04-26 – 2018-04-28 (×3): 500 mg via ORAL
  Filled 2018-04-26 (×3): qty 2

## 2018-04-26 MED ORDER — ACETAZOLAMIDE SODIUM 500 MG IJ SOLR
500.0000 mg | Freq: Once | INTRAMUSCULAR | Status: AC
  Administered 2018-04-26: 500 mg via INTRAVENOUS
  Filled 2018-04-26: qty 500

## 2018-04-26 MED ORDER — MOMETASONE FURO-FORMOTEROL FUM 100-5 MCG/ACT IN AERO
2.0000 | INHALATION_SPRAY | Freq: Two times a day (BID) | RESPIRATORY_TRACT | Status: DC
  Administered 2018-04-26 – 2018-04-28 (×5): 2 via RESPIRATORY_TRACT
  Filled 2018-04-26: qty 8.8

## 2018-04-26 MED ORDER — FUROSEMIDE 10 MG/ML IJ SOLN
40.0000 mg | Freq: Once | INTRAMUSCULAR | Status: AC
  Administered 2018-04-26: 40 mg via INTRAVENOUS

## 2018-04-26 NOTE — Progress Notes (Signed)
MD Juliene Pina was notified of hr in the 40s.

## 2018-04-26 NOTE — Progress Notes (Signed)
PHARMACIST - PHYSICIAN COMMUNICATION CONCERNING: Antibiotic IV to Oral Route Change Policy  RECOMMENDATION: This patient is receiving azithromycin by the intravenous route.  Based on criteria approved by the Pharmacy and Therapeutics Committee, the antibiotic(s) is/are being converted to the equivalent oral dose form(s).   DESCRIPTION: These criteria include:  Patient being treated for a respiratory tract infection, urinary tract infection, cellulitis or clostridium difficile associated diarrhea if on metronidazole  The patient is not neutropenic and does not exhibit a GI malabsorption state  The patient is eating (either orally or via tube) and/or has been taking other orally administered medications for a least 24 hours  The patient is improving clinically and has a Tmax < 100.5  If you have questions about this conversion, please contact the Pharmacy Department  []  ( [phone] )  [place] [x]  ( [phone] )  [facility] []  ( [phone] )  [place] []  ( [phone] )  [facility] []  ( [phone] )  [facility]  

## 2018-04-26 NOTE — Consult Note (Signed)
Cardiology Consultation:   Patient ID: BRANDO TAVES; [id number]; 12/26/1930   Admit date: 04/25/2018 Date of Consult: 04/26/2018  Primary Care Provider: Gracelyn Nurse, MD Primary Cardiologist: NEW    Patient Profile:   Kyle Frederick is a 84 y.o. male w who is being seen today for the evaluation of CHF at the request ofDr Mody  History of Present Illness:   Kyle Frederick is an 84 yo with hx of COPD, ILD (on chronic O2)  Yesterday in acute resp distress  He has a history of remoted CVA, LE edema, MR  Followed at [facility] clinic. He saw Dr. Darrold Junker years ago. The patient was well by his report until a month ago and has begun to increase his fluid build up and sob. He presented with the above. His BNP was essentially normal and he has normal LV function by echo though technically difficult. He denies any h/o tobacco abuse. Unclear to me if he has COPD or interstitial lung disease.   Past Medical History:  Diagnosis Date  . CHF (congestive heart failure) (HCC)   . COPD (chronic obstructive pulmonary disease) (HCC)     History reviewed. No pertinent surgical history.   Home Medications:  Prior to Admission medications   Medication Sig Start Date End Date Taking? Authorizing Provider  acetaminophen (TYLENOL) 650 MG CR tablet Take 2 tablets by mouth every 8 (eight) hours as needed.   Yes [provider]  aspirin 81 MG EC tablet Take 1 tablet by mouth daily.    Yes [provider]  lisinopril-hydrochlorothiazide (PRINZIDE,ZESTORETIC) 20-25 MG tablet Take 1 tablet by mouth daily. 02/09/18  Yes [provider]  polyethylene glycol (MIRALAX / GLYCOLAX) packet Take 1 packet by mouth daily as needed.   Yes [provider]  simvastatin (ZOCOR) 20 MG tablet Take 1 tablet by mouth daily. 02/09/18  Yes [provider]    Inpatient Medications: Scheduled Meds: . aspirin  325 mg Oral Daily  . azithromycin  500 mg Oral Daily  . chlorhexidine   15 mL Mouth Rinse BID  . enoxaparin (LOVENOX) injection  40 mg Subcutaneous Q24H  . furosemide  40 mg Intravenous Once  . Gerhardt's butt cream   Topical TID  . lisinopril  20 mg Oral Daily   And  . hydrochlorothiazide  25 mg Oral Daily  . ipratropium-albuterol  3 mL Nebulization Q6H  . mouth rinse  15 mL Mouth Rinse q12n4p  . mometasone-formoterol  2 puff Inhalation BID  . predniSONE  40 mg Oral Q breakfast  . simvastatin  20 mg Oral Daily   Continuous Infusions:  PRN Meds: acetaminophen **OR** acetaminophen, bisacodyl, ondansetron **OR** ondansetron (ZOFRAN) IV, senna-docusate  Allergies:    Allergies  Allergen Reactions  . Lasix [Furosemide] Other (See Comments)    "syncope"    Social History:   Social History   Socioeconomic History  . Marital status: Single    Spouse name: Not on file  . Number of children: Not on file  . Years of education: Not on file  . Highest education level: Not on file  Occupational History  . Not on file  Social Needs  . Financial resource strain: Not on file  . Food insecurity:    Worry: Not on file    Inability: Not on file  . Transportation needs:    Medical: Not on file    Non-medical: Not on file  Tobacco Use  . Smoking status: Former Games developer  . Smokeless  tobacco: Never Used  Substance and Sexual Activity  . Alcohol use: Not on file  . Drug use: Not on file  . Sexual activity: Not on file  Lifestyle  . Physical activity:    Days per week: Not on file    Minutes per session: Not on file  . Stress: Not on file  Relationships  . Social connections:    Talks on phone: Not on file    Gets together: Not on file    Attends religious service: Not on file    Active member of club or organization: Not on file    Attends meetings of clubs or organizations: Not on file    Relationship status: Not on file  . Intimate partner violence:    Fear of current or ex partner: Not on file    Emotionally abused: Not on file    Physically  abused: Not on file    Forced sexual activity: Not on file  Other Topics Concern  . Not on file  Social History Narrative  . Not on file    Family History:   Father with hx of MI  ROS:  Please see the history of present illness. All other ROS reviewed and negative.     Physical Exam/Data:   Vitals:   04/25/18 1945 04/25/18 2006 04/26/18 0348 04/26/18 0720  BP: 131/68  140/68 139/70  Pulse: 62  (!) 56 (!) 55  Resp:    20  Temp: (!) 97.4 F (36.3 C)  98.1 F (36.7 C)   TempSrc: Oral  Oral   SpO2: 95% 94% 100% 100%  Weight:      Height:        Intake/Output Summary (Last 24 hours) at 04/26/2018 1028 Last data filed at 04/26/2018 0738 Gross per 24 hour  Intake 1220 ml  Output 1350 ml  Net -130 ml   Filed Weights   04/25/18 0027 04/25/18 1900  Weight: 280 lb (127 kg) 252 lb 3.3 oz (114.4 kg)   Body mass index is 37.24 kg/m.  General:   HEENT: diskempt but otherwise normal Lymph: no adenopathy Neck: unable to assess JVD Endocrine:  No thryomegaly Vascular: No carotid bruits; FA pulses 2+ bilaterally without bruits  Cardiac:  normal S1, S2; RRR; no murmur, heart sounds are distant. Lungs: scattered rales, no wheezes or rhonchi Abd: soft, nontender, obese, no hepatomegaly  Ext: no edema Musculoskeletal:  No deformities, BUE and BLE strength normal and equal Skin: warm and dry  Neuro:  CNs 2-12 intact, no focal abnormalities noted Psych:  Normal affect   EKG:  The EKG was personally reviewed and demonstrates:  NSR with PAC's not atrial fib Telemetry:  Telemetry was personally reviewed and demonstrates:  nsr  Relevant CV Studies: Echo :  5/26:    LVEF normal  RVEF normal   Limited study  Laboratory Data:  Chemistry Recent Labs  Lab 04/25/18 0028 04/26/18 0439  NA 140 138  K 4.9 4.7  CL 98* 99*  CO2 37* 30  GLUCOSE 97 145*  BUN 33* 40*  CREATININE 1.44* 1.50*  CALCIUM 9.5 9.5  GFRNONAA 43* 41*  GFRAA 50* 48*  ANIONGAP 5 9    Recent Labs  Lab  04/25/18 0028 04/26/18 0439  PROT 7.7  --   ALBUMIN 4.0 4.0  AST 18  --   ALT 10*  --   ALKPHOS 49  --   BILITOT 0.4  --    Hematology Recent Labs  Lab 04/25/18  0028  WBC 6.3  RBC 3.78*  HGB 9.8*  HCT 31.1*  MCV 82.2  MCH 25.9*  MCHC 31.5*  RDW 14.7*  PLT 185   Cardiac Enzymes Recent Labs  Lab 04/25/18 0028  TROPONINI <0.03   No results for input(s): TROPIPOC in the last 168 hours.  BNP Recent Labs  Lab 04/25/18 0028 04/26/18 0439  BNP 31.0 78.0    DDimer No results for input(s): DDIMER in the last 168 hours.  Radiology/Studies:  Dg Chest Portable 1 View  Result Date: 04/25/2018 CLINICAL DATA:  Acute onset short of breath EXAM: PORTABLE CHEST 1 VIEW COMPARISON:  10/28/2012 FINDINGS: Cardiomegaly. Coarse left greater than right interstitial opacity, suspect chronic change. No acute consolidation or effusion. Aortic atherosclerosis. No pneumothorax. IMPRESSION: Cardiomegaly. Increased interstitial opacity, similar appearance to prior and suggestive of chronic interstitial disease. Atelectasis or scar at the left base. Electronically Signed   By: Jasmine Pang M.D.   On: 04/25/2018 00:59    Assessment and Plan:   1. Acute on chronic sob - I suspect that this is multifactorial in etiology. I would suggest he continue careful diuretic therapy, low sodium diet, and elevation of his legs. Unclear to me what his underlying lung diagnosis is that has causes his interstitial lung disease. Finally, if he is refractory to diuresis then could consider right heart cath. 2. Stage 2 renal failure - diuresis will be a bit challenging. Watch renal function carefully.  3. Morbid obesity - a cornerstone of his treatment will be encouragement of weight loss.  4. Disp. - the patient would like to return to Dr. Darrold Junker as an outpatient.  For questions or updates, please contact [facility] Please consult [email or web address] for contact info under Cardiology/STEMI.   Loma Sender  Kamille Toomey,M.D. 04/26/2018

## 2018-04-26 NOTE — Progress Notes (Signed)
[employer] [facility] - [place] at [facility]   PATIENT NAME: Kyle Frederick    MR#:  [id number]  DATE OF BIRTH:  February 08, 1931  SUBJECTIVE:   Patient still with feelings of shortness of breath and extreme lower extremity edema At baseline he has some confusion from previous CVA  REVIEW OF SYSTEMS:    Review of Systems  Constitutional: Negative for fever, chills weight loss HENT: Negative for ear pain, nosebleeds, congestion, facial swelling, rhinorrhea, neck pain, neck stiffness and ear discharge.   Respiratory: Denies cough, ++shortness of breath, NO wheezing  Cardiovascular: Negative for chest pain, palpitations and ++leg swelling.  Gastrointestinal: Negative for heartburn, abdominal pain, vomiting, diarrhea or consitpation Genitourinary: Negative for dysuria, urgency, frequency, hematuria Musculoskeletal: Negative for back pain or joint pain Neurological: Negative for dizziness, seizures, syncope, focal weakness,  numbness and headaches.  Hematological: Does not bruise/bleed easily.  Psychiatric/Behavioral: Negative for hallucinations,  no dysphoric mood  ++confusion   Tolerating Diet: yes      DRUG ALLERGIES:   Allergies  Allergen Reactions  . Lasix [Furosemide] Other (See Comments)    "syncope"    VITALS:  Blood pressure 139/70, pulse (!) 55, temperature 98.1 F (36.7 C), temperature source Oral, resp. rate 20, height  (1.753 m), weight 114.4 kg (252 lb 3.3 oz), SpO2 100 %.  PHYSICAL EXAMINATION:  Constitutional: Appears well-developed and well-nourished. No distress.obese HENT: Normocephalic. Marland Kitchen Oropharynx is clear and moist.  Eyes: Conjunctivae and EOM are normal. PERRLA, no scleral icterus.  Neck: Normal ROM. Neck supple. No JVD. No tracheal deviation. CVS: RRR, S1/S2 +,+ murmurs, no gallops, no carotid bruit.  Pulmonary: diminished breath sounds, inspiratory fine crackles lower base abdominal: Soft. BS +,  no distension, tenderness, rebound or  guarding. obese Musculoskeletal: Normal range of motion. 4++ LEE and no tenderness.  Neuro: Alert. CN 2-12 grossly intact. No focal deficits. Skin: Skin is warm and dry. No rash noted. Psychiatric: Confused oriented to name and place not time    LABORATORY PANEL:   CBC Recent Labs  Lab 04/25/18 0028  WBC 6.3  HGB 9.8*  HCT 31.1*  PLT 185   ------------------------------------------------------------------------------------------------------------------  Chemistries  Recent Labs  Lab 04/25/18 0028 04/26/18 0439  NA 140 138  K 4.9 4.7  CL 98* 99*  CO2 37* 30  GLUCOSE 97 145*  BUN 33* 40*  CREATININE 1.44* 1.50*  CALCIUM 9.5 9.5  AST 18  --   ALT 10*  --   ALKPHOS 49  --   BILITOT 0.4  --    ------------------------------------------------------------------------------------------------------------------  Cardiac Enzymes Recent Labs  Lab 04/25/18 0028  TROPONINI <0.03   ------------------------------------------------------------------------------------------------------------------  RADIOLOGY:  Dg Chest Portable 1 View  Result Date: 04/25/2018 CLINICAL DATA:  Acute onset short of breath EXAM: PORTABLE CHEST 1 VIEW COMPARISON:  10/28/2012 FINDINGS: Cardiomegaly. Coarse left greater than right interstitial opacity, suspect chronic change. No acute consolidation or effusion. Aortic atherosclerosis. No pneumothorax. IMPRESSION: Cardiomegaly. Increased interstitial opacity, similar appearance to prior and suggestive of chronic interstitial disease. Atelectasis or scar at the left base. Electronically Signed   By: Jasmine Pang M.D.   On: 04/25/2018 00:59     ASSESSMENT AND PLAN:   84 year old male with history of cardiomegaly and COPD who presents with cough and shortness of breath   1.  Acute hypoxic and hypercarbic respiratory failure with history of interstitial lung disease and COPD Patient weaned from BiPAP to 2 L nasal cannula.  He will need to continue  BiPAP at  night as per recommendations by pulmonary.   2.  Acute exacerbation of COPD: Continue bronchodilators, steroids and add Dulera  continue azithromycin for acute bronchitis resulting in acute exacerbation of COPD 3.  Acute on chronic diastolic heart failure with anasarca: Cardiology consultation for further recommendation Patient responded well to Diamox will order one-time dose Follow electrolytes Follow intake and output with daily weight  4.  Essential hypertension: Continue lisinopril and HCTZ  5.  Hyperlipidemia: Continue statin  6.  History of interstitial lung disease: Patient will need outpatient pulmonary follow-up upon discharge  7.  History of CVA with confusion at baseline Aspirin and statin 8.  Chronic kidney disease stage III: Creatinine at baseline Management plans discussed with the patient and family and they are in agreement.  CODE STATUS:DNR TOTAL TIME TAKING CARE OF THIS PATIENT: 28 minutes.  PT consult for discharge planning   POSSIBLE D/C 2 days, DEPENDING ON CLINICAL CONDITION.   Rossanna Spitzley M.D on 04/26/2018 at 8:59 AM  Between 7am to 6pm - Pager - [phone] After 6pm go to [email or web address] - password [email or web address]  [facility]  Office  [phone]  CC: Primary care physician; Gracelyn Nurse, MD  Note: This dictation was prepared with Dragon dictation along with smaller phrase technology. Any transcriptional errors that result from this process are unintentional.

## 2018-04-27 ENCOUNTER — Inpatient Hospital Stay: Payer: Medicare Other

## 2018-04-27 DIAGNOSIS — J84112 Idiopathic pulmonary fibrosis: Secondary | ICD-10-CM

## 2018-04-27 LAB — BASIC METABOLIC PANEL
ANION GAP: 4 — AB (ref 5–15)
BUN: 51 mg/dL — ABNORMAL HIGH (ref 6–20)
CALCIUM: 9.2 mg/dL (ref 8.9–10.3)
CO2: 29 mmol/L (ref 22–32)
CREATININE: 2 mg/dL — AB (ref 0.61–1.24)
Chloride: 104 mmol/L (ref 101–111)
GFR, EST AFRICAN AMERICAN: 34 mL/min — AB (ref >60)
GFR, EST NON AFRICAN AMERICAN: 29 mL/min — AB (ref >60)
GLUCOSE: 108 mg/dL — AB (ref 65–99)
Potassium: 5.6 mmol/L — ABNORMAL HIGH (ref 3.5–5.1)
Sodium: 137 mmol/L (ref 135–145)

## 2018-04-27 LAB — PROTEIN / CREATININE RATIO, URINE
Creatinine, Urine: 92 mg/dL
Protein Creatinine Ratio: 0.12 mg/mg{Cre} (ref 0.00–0.15)
Total Protein, Urine: 11 mg/dL

## 2018-04-27 MED ORDER — SODIUM POLYSTYRENE SULFONATE 15 GM/60ML PO SUSP
30.0000 g | Freq: Once | ORAL | Status: AC
  Administered 2018-04-27: 30 g via ORAL
  Filled 2018-04-27: qty 120

## 2018-04-27 MED ORDER — PATIROMER SORBITEX CALCIUM 8.4 G PO PACK
8.4000 g | PACK | Freq: Every day | ORAL | Status: DC
  Administered 2018-04-28: 8.4 g via ORAL
  Filled 2018-04-27 (×2): qty 1

## 2018-04-27 MED ORDER — INSULIN ASPART 100 UNIT/ML IV SOLN
10.0000 [IU] | Freq: Once | INTRAVENOUS | Status: AC
  Administered 2018-04-27: 10 [IU] via INTRAVENOUS
  Filled 2018-04-27: qty 0.1

## 2018-04-27 MED ORDER — DEXTROSE 50 % IV SOLN
25.0000 mL | Freq: Once | INTRAVENOUS | Status: AC
Start: 2018-04-27 — End: 2018-04-27
  Administered 2018-04-27: 25 mL via INTRAVENOUS
  Filled 2018-04-27: qty 50

## 2018-04-27 NOTE — Consult Note (Addendum)
WOC Nurse wound consult note Reason for Consult: Placement of Unna boots Wound type: NO wounds present to either lower leg or foot I do not see any diagnostic study for blood flow to the patient's legs.  There is +4 pitting edema bilaterally.  I cannot palpate either pedal pulse, and the patient states he has never had compression wraps before.  Currently his medical problem list includes:  Acute on chronic HF; interstitial lung disease; acute on chronic kidney disease stage III; acute exacerbation of COPD; and acute hypoxic and hypercarbic respiratory failure.  My fear of placing unna boot compression on this fragile patient with cardiac, respiratory, and renal issues includes fluid overload from displacement of lower extremity edema.  A gentle approach to lower extremity edema reduction includes:  Spiral wrap kerlex to lower extremities, then spiral wrap ACE wraps to lower extremities.  Change daily.  If patient tolerates this well, we can re-evaluate Unna boots later this week. Helmut Muster, RN, MSN, CWOCN, CNS-BC, pager [phone]

## 2018-04-27 NOTE — Progress Notes (Signed)
[employer] [facility] - [place] at [facility]   PATIENT NAME: Kyle Frederick    MR#:  [id number]  DATE OF BIRTH:  05-15-1931  SUBJECTIVE:   Patient doing much better  LEE swelling with some improvement  REVIEW OF SYSTEMS:    Review of Systems  Constitutional: Negative for fever, chills weight loss HENT: Negative for ear pain, nosebleeds, congestion, facial swelling, rhinorrhea, neck pain, neck stiffness and ear discharge.   Respiratory: Denies cough, denies shortness of breath, NO wheezing  Cardiovascular: Negative for chest pain, palpitations and ++leg swelling.  Gastrointestinal: Negative for heartburn, abdominal pain, vomiting, diarrhea or consitpation Genitourinary: Negative for dysuria, urgency, frequency, hematuria Musculoskeletal: Negative for back pain or joint pain Neurological: Negative for dizziness, seizures, syncope, focal weakness,  numbness and headaches.  Hematological: Does not bruise/bleed easily.  Psychiatric/Behavioral: Negative for hallucinations,  no dysphoric mood  ++confusion at baseline  Tolerating Diet: yes      DRUG ALLERGIES:   Allergies  Allergen Reactions  . Lasix [Furosemide] Other (See Comments)    "syncope"    VITALS:  Blood pressure 125/67, pulse 65, temperature 98.1 F (36.7 C), temperature source Oral, resp. rate 17, height  (1.753 m), weight 116.2 kg (256 lb 3.2 oz), SpO2 99 %.  PHYSICAL EXAMINATION:  Constitutional: Appears well-developed and well-nourished. No distress.obese HENT: Normocephalic. Marland Kitchen Oropharynx is clear and moist.  Eyes: Conjunctivae and EOM are normal. PERRLA, no scleral icterus.  Neck: Normal ROM. Neck supple. No JVD. No tracheal deviation. CVS: RRR, S1/S2 +,+ murmurs, no gallops, no carotid bruit.  Pulmonary: diminished breath sounds, inspiratory fine crackles lower base abdominal: Soft. BS +,  no distension, tenderness, rebound or guarding. obese Musculoskeletal: Normal range of motion. 3++ LEE  and no tenderness.  Neuro: Alert. CN 2-12 grossly intact. No focal deficits. Skin: Skin is warm and dry. No rash noted. Psychiatric: Confused oriented to name and place not time    LABORATORY PANEL:   CBC Recent Labs  Lab 04/25/18 0028  WBC 6.3  HGB 9.8*  HCT 31.1*  PLT 185   ------------------------------------------------------------------------------------------------------------------  Chemistries  Recent Labs  Lab 04/25/18 0028  04/27/18 0634  NA 140   < > 137  K 4.9   < > 5.6*  CL 98*   < > 104  CO2 37*   < > 29  GLUCOSE 97   < > 108*  BUN 33*   < > 51*  CREATININE 1.44*   < > 2.00*  CALCIUM 9.5   < > 9.2  AST 18  --   --   ALT 10*  --   --   ALKPHOS 49  --   --   BILITOT 0.4  --   --    < > = values in this interval not displayed.   ------------------------------------------------------------------------------------------------------------------  Cardiac Enzymes Recent Labs  Lab 04/25/18 0028  TROPONINI <0.03   ------------------------------------------------------------------------------------------------------------------  RADIOLOGY:  No results found.   ASSESSMENT AND PLAN:   84 year old male with history of cardiomegaly and COPD who presents with cough and shortness of breath   1.  Acute hypoxic and hypercarbic respiratory failure with history of interstitial lung disease and COPD Patient weaned from BiPAP to 2 L nasal cannula.  He will need to continue BiPAP at night as per recommendations by pulmonary while in the hospital.   2.  Acute exacerbation of COPD: Continue bronchodilators, steroids and Dulera  continue azithromycin for 2 more days for acute bronchitis resulting in acute exacerbation  of COPD  3.  Acute on chronic diastolic heart failure with anasarca/: Cardiology consultation appreciated  Planning increase today.  Patient is 2.1 L negative.   Hold on diuresis for today  Follow electrolytes Follow intake and output with daily  weight Care consult for Unna boots Consider right heart catheterization as per cardiology  4.  Essential hypertension: Continue HCTZ  5.  Hyperlipidemia: Continue statin  6.  History of interstitial lung disease: Patient will need outpatient pulmonary follow-up upon discharge He would benefit from outpatient sleep evaluation as well 7.  History of CVA with confusion at baseline Aspirin and statin 8.  Acute on chronic kidney disease stage III: Consultation with nephrology due to increased creatinine probably from overdiuresis  9.  Hyperkalemia: Treat with insulin, dextrose and Kayexalate Stop ACE inhibitor and repeat potassium  Management plans discussed with the patient and family and they are in agreement.  CODE STATUS:DNR TOTAL TIME TAKING CARE OF THIS PATIENT: 24 minutes.  PT consult for discharge planning   POSSIBLE D/C 2 days, DEPENDING ON CLINICAL CONDITION.   Madesyn Ast M.D on 04/27/2018 at 8:51 AM  Between 7am to 6pm - Pager - [phone] After 6pm go to [email or web address] - password [email or web address]  [facility] [place] [facility]  Office  [phone]  CC: Primary care physician; Gracelyn Nurse, MD  Note: This dictation was prepared with Dragon dictation along with smaller phrase technology. Any transcriptional errors that result from this process are unintentional.

## 2018-04-27 NOTE — Consult Note (Signed)
[facility] [place] Pulmonary Medicine Consultation      Assessment and Plan:  Acute on chronic hypoxic respiratory failure. Volume overload with pleural effusions. Chronically elevated right diaphragm, left lower lobe atelectasis, possible pleural effusion. Wheezing on exam, which could be due to COPD exacerbation, versus volume overload. Interstitial lung disease, pulmonary fibrosis per history, seen on previous CT chest  - We will check CT chest without contrast to check for left lower lobe atelectasis versus effusion and to see if the patient's interstitial disease has advanced.  If positive for effusion patient may benefit from thoracentesis. - Continue diuresis, wean steroids. - Continue nebulized medicine. -Wean down oxygen as tolerated, advance activity as tolerated.     Date: 04/27/2018  MRN# [id number] Kyle Frederick [birth year]  Referring Physician: Dr. Juliene Pina.   Kyle Frederick is a 84 y.o. old male seen in consultation for chief complaint of:    Chief Complaint  Patient presents with  . Shortness of Breath    HPI:  The patient is 84 year old male, presented to the ED on 04/25/2018 with shortness of breath via EMS.  Patient has a history of COPD, CVA, cardiomegaly, possible interstitial lung disease.  Patient was admitted with diagnosis of acute COPD exacerbation.  Initial BNP 31, troponins negative.  Patient was noted to have a mild acute kidney injury, progressed since admission, no significant leukocytosis.  Patient is currently on Lasix 40 mg daily, Dulera, prednisone 40 mg daily. Patient tells me that he lives alone in his own apartment, he has been doing all of his own ADLs, he has mild dyspnea in the home and he tells me he is not hindered by his breathing, though this seems hard to believe.  He cannot recall which inhalers, if any that he uses.  He cannot recall whether he uses any nebulized treatments at home.  He tells me that he uses 4 L of oxygen chronically at  home.  **Imaging personally reviewed 04/25/2018, there is bibasilar infiltrates, opacity suggestive of effusions.  Previous chest x-ray shows chronically elevated right diaphragm.  Previous CT chest 07/01/2013, shows bibasilar interstitial changes suggestive of pulmonary fibrosis. **Echocardiogram 04/25/2018; EF equals 55%   PMHX:   Past Medical History:  Diagnosis Date  . CHF (congestive heart failure) (HCC)   . COPD (chronic obstructive pulmonary disease) (HCC)    Surgical Hx:  History reviewed. No pertinent surgical history. Family Hx:  History reviewed. No pertinent family history. Social Hx:   Social History   Tobacco Use  . Smoking status: Former Games developer  . Smokeless tobacco: Never Used  Substance Use Topics  . Alcohol use: Not on file  . Drug use: Not on file   Medication:    Current Facility-Administered Medications:  .  acetaminophen (TYLENOL) tablet 650 mg, 650 mg, Oral, Q6H PRN **OR** acetaminophen (TYLENOL) suppository 650 mg, 650 mg, Rectal, Q6H PRN, Marjie Skiff, Prasanna, MD .  aspirin tablet 325 mg, 325 mg, Oral, Daily, Mody, Sital, MD, 325 mg at 04/27/18 0816 .  azithromycin (ZITHROMAX) tablet 500 mg, 500 mg, Oral, Daily, Mody, Sital, MD, 500 mg at 04/27/18 0815 .  bisacodyl (DULCOLAX) EC tablet 5 mg, 5 mg, Oral, Daily PRN, Marjie Skiff, Prasanna, MD .  chlorhexidine (PERIDEX) 0.12 % solution 15 mL, 15 mL, Mouth Rinse, BID, Tukov-Yual, Magdalene S, NP, 15 mL at 04/27/18 0820 .  enoxaparin (LOVENOX) injection 40 mg, 40 mg, Subcutaneous, Q24H, Sridharan, Prasanna, MD, 40 mg at 04/27/18 0620 .  furosemide (LASIX) injection 40 mg, 40 mg, Intravenous,  Once, Tukov-Yual, Alroy Bailiff, NP .  Gerhardt's butt cream, , Topical, TID, Mody, Sital, MD .  ipratropium-albuterol (DUONEB) 0.5-2.5 (3) MG/3ML nebulizer solution 3 mL, 3 mL, Nebulization, Q6H, Mody, Sital, MD, 3 mL at 04/27/18 0920 .  MEDLINE mouth rinse, 15 mL, Mouth Rinse, q12n4p, Tukov-Yual, Magdalene S, NP, 15 mL at 04/25/18  1600 .  mometasone-formoterol (DULERA) 100-5 MCG/ACT inhaler 2 puff, 2 puff, Inhalation, BID, Adrian Saran, MD, 2 puff at 04/27/18 0816 .  ondansetron (ZOFRAN) tablet 4 mg, 4 mg, Oral, Q6H PRN **OR** ondansetron (ZOFRAN) injection 4 mg, 4 mg, Intravenous, Q6H PRN, Barbaraann Rondo, MD, 4 mg at 04/26/18 0925 .  [COMPLETED] methylPREDNISolone sodium succinate (SOLU-MEDROL) 125 mg/2 mL injection 60 mg, 60 mg, Intravenous, Q12H, 60 mg at 04/25/18 2046 **FOLLOWED BY** predniSONE (DELTASONE) tablet 40 mg, 40 mg, Oral, Q breakfast, Marjie Skiff, Prasanna, MD, 40 mg at 04/27/18 0815 .  senna-docusate (Senokot-S) tablet 1 tablet, 1 tablet, Oral, QHS PRN, Marjie Skiff, Prasanna, MD .  simvastatin (ZOCOR) tablet 20 mg, 20 mg, Oral, Daily, Marjie Skiff, Prasanna, MD, 20 mg at 04/26/18 1703   Allergies:  Lasix [furosemide]  Review of Systems: Gen:  Denies  fever, sweats, chills HEENT: Denies blurred vision, double vision. bleeds, sore throat Cvc:  No dizziness, chest pain. Resp:   Denies cough or sputum production, shortness of breath Gi: Denies swallowing difficulty, stomach pain. Gu:  Denies bladder incontinence, burning urine Ext:   No Joint pain, stiffness. Skin: No skin rash,  hives  Endoc:  No polyuria, polydipsia. Psych: No depression, insomnia. Other:  All other systems were reviewed with the patient and were negative other that what is mentioned in the HPI.   Physical Examination:   VS: BP 125/67 (BP Location: Left Arm)   Pulse 65   Temp 98.1 F (36.7 C) (Oral)   Resp 17   Ht  (1.753 m)   Wt 256 lb 3.2 oz (116.2 kg)   SpO2 99%   BMI 37.83 kg/m   General Appearance: dyspneic.  Neuro:without focal findings,  speech normal,  HEENT: PERRLA, EOM intact.   Pulmonary: bilateral wheezing.  CardiovascularNormal S1,S2.  No m/r/g.   Abdomen: Benign, Soft, non-tender. Renal:  No costovertebral tenderness  GU:  No performed at this time. Endoc: No evident thyromegaly, no signs of  acromegaly. Skin:   warm, no rashes, no ecchymosis  Extremities: normal, no cyanosis, clubbing.  Other findings:    LABORATORY PANEL:   CBC Recent Labs  Lab 04/25/18 0028  WBC 6.3  HGB 9.8*  HCT 31.1*  PLT 185   ------------------------------------------------------------------------------------------------------------------  Chemistries  Recent Labs  Lab 04/25/18 0028  04/27/18 0634  NA 140   < > 137  K 4.9   < > 5.6*  CL 98*   < > 104  CO2 37*   < > 29  GLUCOSE 97   < > 108*  BUN 33*   < > 51*  CREATININE 1.44*   < > 2.00*  CALCIUM 9.5   < > 9.2  AST 18  --   --   ALT 10*  --   --   ALKPHOS 49  --   --   BILITOT 0.4  --   --    < > = values in this interval not displayed.   ------------------------------------------------------------------------------------------------------------------  Cardiac Enzymes Recent Labs  Lab 04/25/18 0028  TROPONINI <0.03   ------------------------------------------------------------  RADIOLOGY:  No results found.     Thank  you for the consultation  and for allowing [facility] to assist in the care of your patient. Our recommendations are noted above.  Please contact us if we can be of further service.   Wells Guiles, MD.  Board Certified in Internal Medicine, Pulmonary Medicine, Critical Care Medicine, and Sleep Medicine.  [facility] Office Number: [phone]  Santiago Glad, M.D.  Billy Fischer, M.D  04/27/2018

## 2018-04-27 NOTE — Plan of Care (Signed)
  Problem: Clinical Measurements: Goal: Will remain free from infection Outcome: Progressing   Problem: Pain Managment: Goal: General experience of comfort will improve Outcome: Progressing   Problem: Respiratory: Goal: Levels of oxygenation will improve Outcome: Progressing

## 2018-04-27 NOTE — Progress Notes (Signed)
PT Cancellation Note  Patient Details Name: Kyle Frederick MRN: [id number] DOB: 25-Oct-1931   Cancelled Treatment:    Reason Eval/Treat Not Completed: Medical issues which prohibited therapy; Ka 5.6 and trending up which is outside guidelines for participation with PT services.  Will attempt to see pt at a future date/time as medically appropriate.     Ovidio Hanger PT, DPT 04/27/18, 12:55 PM

## 2018-04-27 NOTE — Consult Note (Signed)
[facility] CONSULT NOTE    Date: 04/27/2018                  Patient Name:  Kyle Frederick  MRN: [id number]  DOB: 03-15-1931  Age / Sex: 84 y.o., male         PCP: Gracelyn Nurse, MD                 Service Requesting Consult: Hospitalist                 Reason for Consult: Acute renal failure, CKD stage III            History of Present Illness: Patient is a 84 y.o. male with a PMHx of COPD, prior CVA, interstitial lung disease, mild pulmonary hypertension, osteoarthritis, hisotry of congestive heart failure with normal ejection fraction, who was admitted to [facility] on 04/25/2018 for evaluation of shortness of breath.  The patient was brought to the hospital after EMS was called.  The patient's son initially found him to be in significant shortness of breath.  He was initially started on CPAP by EMS.  Then he was subsequently started on BiPAP upon arrival here.  He is on chronic oxygen.  Patient has been receiving diuresis while here with deteriorating renal function.  Creatinine was 1.4 upon admission and now up to 2.0 with a BUN of 51.  Patient was also on lisinopril/HCTZ at home.   Medications: Outpatient medications: Medications Prior to Admission  Medication Sig Dispense Refill Last Dose  . acetaminophen (TYLENOL) 650 MG CR tablet Take 2 tablets by mouth every 8 (eight) hours as needed.   prn at prn  . aspirin 81 MG EC tablet Take 1 tablet by mouth daily.    Unknown at Unknown  . lisinopril-hydrochlorothiazide (PRINZIDE,ZESTORETIC) 20-25 MG tablet Take 1 tablet by mouth daily.   Unknown at unknown  . polyethylene glycol (MIRALAX / GLYCOLAX) packet Take 1 packet by mouth daily as needed.   prn at prn  . simvastatin (ZOCOR) 20 MG tablet Take 1 tablet by mouth daily.  0 Unknown at Unknown    Current medications: Current Facility-Administered Medications  Medication Dose Route Frequency Provider Last Rate Last Dose  . acetaminophen (TYLENOL) tablet 650  mg  650 mg Oral Q6H PRN Barbaraann Rondo, MD       Or  . acetaminophen (TYLENOL) suppository 650 mg  650 mg Rectal Q6H PRN Barbaraann Rondo, MD      . aspirin tablet 325 mg  325 mg Oral Daily Adrian Saran, MD   325 mg at 04/27/18 0816  . azithromycin (ZITHROMAX) tablet 500 mg  500 mg Oral Daily Adrian Saran, MD   500 mg at 04/27/18 0815  . bisacodyl (DULCOLAX) EC tablet 5 mg  5 mg Oral Daily PRN Barbaraann Rondo, MD      . chlorhexidine (PERIDEX) 0.12 % solution 15 mL  15 mL Mouth Rinse BID Tukov-Yual, Magdalene S, NP   15 mL at 04/27/18 0820  . enoxaparin (LOVENOX) injection 40 mg  40 mg Subcutaneous Q24H Barbaraann Rondo, MD   40 mg at 04/27/18 0620  . furosemide (LASIX) injection 40 mg  40 mg Intravenous Once Tukov-Yual, Alroy Bailiff, NP      . Gerhardt's butt cream   Topical TID Mody, Sital, MD      . ipratropium-albuterol (DUONEB) 0.5-2.5 (3) MG/3ML nebulizer solution 3 mL  3 mL Nebulization Q6H Mody, Sital, MD   3 mL at  04/27/18 1337  . MEDLINE mouth rinse  15 mL Mouth Rinse q12n4p Tukov-Yual, Magdalene S, NP   15 mL at 04/25/18 1600  . mometasone-formoterol (DULERA) 100-5 MCG/ACT inhaler 2 puff  2 puff Inhalation BID Adrian Saran, MD   2 puff at 04/27/18 0816  . ondansetron (ZOFRAN) tablet 4 mg  4 mg Oral Q6H PRN Barbaraann Rondo, MD       Or  . ondansetron (ZOFRAN) injection 4 mg  4 mg Intravenous Q6H PRN Barbaraann Rondo, MD   4 mg at 04/26/18 0925  . predniSONE (DELTASONE) tablet 40 mg  40 mg Oral Q breakfast Barbaraann Rondo, MD   40 mg at 04/27/18 0815  . senna-docusate (Senokot-S) tablet 1 tablet  1 tablet Oral QHS PRN Barbaraann Rondo, MD      . simvastatin (ZOCOR) tablet 20 mg  20 mg Oral Daily Barbaraann Rondo, MD   20 mg at 04/26/18 1703      Allergies: Allergies  Allergen Reactions  . Lasix [Furosemide] Other (See Comments)    "syncope"      Past Medical History: Past Medical History:  Diagnosis Date  . CHF (congestive heart failure) (HCC)    . COPD (chronic obstructive pulmonary disease) (HCC)      Past Surgical History: History reviewed. No pertinent surgical history.   Family History: History reviewed. No pertinent family history.   Social History: Social History   Socioeconomic History  . Marital status: Single    Spouse name: Not on file  . Number of children: Not on file  . Years of education: Not on file  . Highest education level: Not on file  Occupational History  . Not on file  Social Needs  . Financial resource strain: Not on file  . Food insecurity:    Worry: Not on file    Inability: Not on file  . Transportation needs:    Medical: Not on file    Non-medical: Not on file  Tobacco Use  . Smoking status: Former Games developer  . Smokeless tobacco: Never Used  Substance and Sexual Activity  . Alcohol use: Not on file  . Drug use: Not on file  . Sexual activity: Not on file  Lifestyle  . Physical activity:    Days per week: Not on file    Minutes per session: Not on file  . Stress: Not on file  Relationships  . Social connections:    Talks on phone: Not on file    Gets together: Not on file    Attends religious service: Not on file    Active member of club or organization: Not on file    Attends meetings of clubs or organizations: Not on file    Relationship status: Not on file  . Intimate partner violence:    Fear of current or ex partner: Not on file    Emotionally abused: Not on file    Physically abused: Not on file    Forced sexual activity: Not on file  Other Topics Concern  . Not on file  Social History Narrative  . Not on file     Review of Systems: Review of Systems  Constitutional: Positive for malaise/fatigue. Negative for chills and fever.  HENT: Negative for congestion, hearing loss and tinnitus.   Eyes: Negative for blurred vision.  Respiratory: Positive for shortness of breath.   Cardiovascular: Positive for orthopnea and leg swelling. Negative for chest pain and  palpitations.  Gastrointestinal: Negative for heartburn, nausea and vomiting.  Genitourinary: Negative for dysuria, frequency and urgency.  Musculoskeletal: Negative for back pain and myalgias.  Skin: Negative for itching and rash.  Neurological: Negative for dizziness and focal weakness.  Endo/Heme/Allergies: Negative for polydipsia. Does not bruise/bleed easily.  Psychiatric/Behavioral: Negative for hallucinations. The patient is not nervous/anxious.      Vital Signs: Blood pressure 125/67, pulse 65, temperature 98.1 F (36.7 C), temperature source Oral, resp. rate 17, height  (1.753 m), weight 116.2 kg (256 lb 3.2 oz), SpO2 99 %.  Weight trends: Filed Weights   04/25/18 0027 04/25/18 1900 04/27/18 0404  Weight: 127 kg (280 lb) 114.4 kg (252 lb 3.3 oz) 116.2 kg (256 lb 3.2 oz)    Physical Exam: General: NAD, resting in bed comfortably.  Head: Normocephalic, atraumatic.  Eyes: Anicteric, EOMI  Nose: Mucous membranes moist, not inflammed, nonerythematous.  Throat: Oropharynx nonerythematous, no exudate appreciated.   Neck: Supple, trachea midline.  Lungs:  Normal respiratory effort. Diminshed at bases otherwise clear  Heart: RRR. S1 and S2 normal without gallop, murmur, or rubs.  Abdomen:  BS normoactive. Soft, Nondistended, non-tender.  No masses or organomegaly.  Extremities: 2+ pretibial edema.  Neurologic: A&O X3, Motor strength is 5/5 in the all 4 extremities  Skin: No visible rashes, scars.    Lab results: Basic Metabolic Panel: Recent Labs  Lab 04/25/18 0028 04/26/18 0439 04/27/18 0634  NA 140 138 137  K 4.9 4.7 5.6*  CL 98* 99* 104  CO2 37* 30 29  GLUCOSE 97 145* 108*  BUN 33* 40* 51*  CREATININE 1.44* 1.50* 2.00*  CALCIUM 9.5 9.5 9.2  PHOS  --  2.6  --     Liver Function Tests: Recent Labs  Lab 04/25/18 0028 04/26/18 0439  AST 18  --   ALT 10*  --   ALKPHOS 49  --   BILITOT 0.4  --   PROT 7.7  --   ALBUMIN 4.0 4.0   No results for  input(s): LIPASE, AMYLASE in the last 168 hours. No results for input(s): AMMONIA in the last 168 hours.  CBC: Recent Labs  Lab 04/25/18 0028  WBC 6.3  NEUTROABS 4.1  HGB 9.8*  HCT 31.1*  MCV 82.2  PLT 185    Cardiac Enzymes: Recent Labs  Lab 04/25/18 0028  TROPONINI <0.03    BNP: Invalid input(s): POCBNP  CBG: Recent Labs  Lab 04/25/18 0322  GLUCAP 88    Microbiology: Results for orders placed or performed during the hospital encounter of 04/25/18  MRSA PCR Screening     Status: None   Collection Time: 04/25/18  4:00 AM  Result Value Ref Range Status   MRSA by PCR NEGATIVE NEGATIVE Final    Comment:        The GeneXpert MRSA Assay (FDA approved for NASAL specimens only), is one component of a comprehensive MRSA colonization surveillance program. It is not intended to diagnose MRSA infection nor to guide or monitor treatment for MRSA infections. Performed at [facility], [address] [place], [place] [address]     Coagulation Studies: No results for input(s): LABPROT, INR in the last 72 hours.  Urinalysis: No results for input(s): COLORURINE, LABSPEC, PHURINE, GLUCOSEU, HGBUR, BILIRUBINUR, KETONESUR, PROTEINUR, UROBILINOGEN, NITRITE, LEUKOCYTESUR in the last 72 hours.  Invalid input(s): APPERANCEUR    Imaging: Ct Chest Wo Contrast  Result Date: 04/27/2018 CLINICAL DATA:  Acute on chronic hypoxic respiratory failure. Volume overload with pleural effusions. Wheezing. History of pulmonary fibrosis. EXAM: CT CHEST  WITHOUT CONTRAST TECHNIQUE: Multidetector CT imaging of the chest was performed following the standard protocol without IV contrast. COMPARISON:  Radiography 04/25/2018. Radiography 10/28/2012. CT 06/19/2009. FINDINGS: Cardiovascular: The heart is mildly enlarged. No pericardial fluid. There is extensive coronary artery calcification. There is aortic atherosclerosis. Mediastinum/Nodes: No mediastinal mass or lymphadenopathy.  Lungs/Pleura: There is no pleural effusion. There is no lobar collapse. No sign of consolidation. There is scarring and there are changes of bronchiectasis within each lobe. Compared to the study 2010, the findings are remarkably similar with only mild progression. Upper Abdomen: Chololithiasis without CT evidence of cholecystitis. Musculoskeletal: Ordinary spinal degenerative changes. IMPRESSION: No pleural effusion.  No atelectasis.  No active infiltrate. Chronic bronchiectasis and pulmonary scarring, remarkably similar to the prior study of 06/19/2009. Mild cardiomegaly. Extensive coronary artery calcification. Aortic atherosclerosis. Electronically Signed   By: Paulina Fusi M.D.   On: 04/27/2018 16:05      Assessment & Plan: Pt is a 84 y.o. male with a PMHx of COPD, prior CVA, interstitial lung disease, mild pulmonary hypertension, osteoarthritis, hisotry of congestive heart failure with normal ejection fraction, who was admitted to [facility] on 04/25/2018 for evaluation of shortness of breath.   1.  Acute renal failure, ? Secondary to diuresis. 2.  CKD stage III baseline Cr 1.4. 3.  Anemia of CKD. 4.  Hyperkalemia.    Plan:  We were asked to see the patient for evaluation management of acute renal failure in the setting of known chronic kidney disease stage III.  Patient came in with shortness of breath and it has been thought that he has underlying heart failure however his ejection fraction is 55-60%.  It could not be determined as to whether he has some element of diastolic dysfunction.  He did have significant lower extremity edema upon presentation and even now.  We will proceed with additional workup including renal ultrasound, SPEP, UPEP,ANA, and urine protein to creatinine ratio.  We would recommend holding Lasix going forward and determine his response.  If renal function does not improve thereafter we may need to consider administering a slow rate IV fluids but we will hold off on this for  now.  We will start the patient on patiromer 8.4 g by mouth daily for mild hyperkalemia.  Agree with holding lisinopril/HCTZ for now.  Further plan as patient progresses.

## 2018-04-27 NOTE — Progress Notes (Signed)
Patient requests to be followed by Dr. Darrold Junker. Notified telemetry to page [facility] for inpatient consult.

## 2018-04-27 NOTE — Progress Notes (Signed)
Performed wound care to bilateral legs, spiral wrap  kerlex and then applied ACE wrap in same fashion.

## 2018-04-27 NOTE — Progress Notes (Signed)
Pt is waiting for bedtime meds before placing BIPAP

## 2018-04-28 ENCOUNTER — Inpatient Hospital Stay: Payer: Medicare Other

## 2018-04-28 DIAGNOSIS — I5031 Acute diastolic (congestive) heart failure: Secondary | ICD-10-CM

## 2018-04-28 LAB — BASIC METABOLIC PANEL
Anion gap: 8 (ref 5–15)
BUN: 52 mg/dL — AB (ref 6–20)
CALCIUM: 9.3 mg/dL (ref 8.9–10.3)
CO2: 29 mmol/L (ref 22–32)
Chloride: 100 mmol/L — ABNORMAL LOW (ref 101–111)
Creatinine, Ser: 1.6 mg/dL — ABNORMAL HIGH (ref 0.61–1.24)
GFR calc Af Amer: 44 mL/min — ABNORMAL LOW (ref >60)
GFR calc non Af Amer: 38 mL/min — ABNORMAL LOW (ref >60)
GLUCOSE: 122 mg/dL — AB (ref 65–99)
Potassium: 4.6 mmol/L (ref 3.5–5.1)
Sodium: 137 mmol/L (ref 135–145)

## 2018-04-28 LAB — CBC
HCT: 32.8 % — ABNORMAL LOW (ref 40.0–52.0)
Hemoglobin: 10.3 g/dL — ABNORMAL LOW (ref 13.0–18.0)
MCH: 26.1 pg (ref 26.0–34.0)
MCHC: 31.5 g/dL — AB (ref 32.0–36.0)
MCV: 82.8 fL (ref 80.0–100.0)
PLATELETS: 174 10*3/uL (ref 150–440)
RBC: 3.97 MIL/uL — ABNORMAL LOW (ref 4.40–5.90)
RDW: 15.4 % — AB (ref 11.5–14.5)
WBC: 12 10*3/uL — ABNORMAL HIGH (ref 3.8–10.6)

## 2018-04-28 MED ORDER — PREDNISONE 20 MG PO TABS: 40.0000 mg | ORAL_TABLET | Freq: Every day | ORAL | 0 refills | Status: AC

## 2018-04-28 MED ORDER — ROSUVASTATIN CALCIUM 20 MG PO TABS: 20.0000 mg | ORAL_TABLET | Freq: Every day | ORAL | 11 refills | Status: DC

## 2018-04-28 MED ORDER — GERHARDT'S BUTT CREAM: 1.0000 "application " | TOPICAL_CREAM | Freq: Three times a day (TID) | CUTANEOUS | 0 refills | Status: AC

## 2018-04-28 MED ORDER — IPRATROPIUM-ALBUTEROL 0.5-2.5 (3) MG/3ML IN SOLN: 3.0000 mL | Freq: Four times a day (QID) | RESPIRATORY_TRACT | 0 refills | Status: DC | PRN

## 2018-04-28 MED ORDER — MOMETASONE FURO-FORMOTEROL FUM 100-5 MCG/ACT IN AERO: 2.0000 | INHALATION_SPRAY | Freq: Two times a day (BID) | RESPIRATORY_TRACT | 0 refills | Status: DC

## 2018-04-28 NOTE — Clinical Social Work Note (Signed)
CSW spoke with patient and his stepdaughter Jolaine Artist, [phone], about PT recommendations.  CSW informed patient's stepdaughter, that PT is recommending home health and suggested that an ALF may be a good fit for patient.  CSW offered to try to get insurance approval for patient based on his cognitive level, but patient's step daughter did not want CSW to try to get approval.  CSW informed her that if she decided to have patient move to ALF, it would be private pay.  Patient's stepdaughter said he would not be able to afford ALF, CSW explained to her that she would have to apply for Medicaid special assistance and there are some ALFs that will accept Medicaid.  Patient's stepdaughter was not happy about CSW information given to her.  CSW explained to her that unfortunately the other option is to go home with home health.  Patient's stepdaughter was informed that insurance will pay for home health, at first she said not she did not want home health to come in, but then CSW talked to her about the advantages of home health, and she agreed.  CSW notified case Production designer, theatre/television/film and physician.  CSW also informed her that there are private care agencies that she can pay for in home care.  Patient's step daughter stated they have had some private caregivers in the past, and she may pay for some again.  Despite patient's stepdaughter's request for SNF placement, she did not want CSW to begin bed search and insurance authorization for SNF.  CSW to sign off, please reconsult if social work needs arise.  Ervin Knack. Kyriaki Moder, MSW, Theresia Majors [phone]  04/28/2018 12:06 PM

## 2018-04-28 NOTE — Progress Notes (Signed)
[facility]     Assessment and Plan:  Acute on chronic hypoxic respiratory failure. Volume overload with pleural effusions. Chronically elevated bilateral diaphragms, with bibasilar atelectasis. Wheezing on exam, which could be due to COPD exacerbation, versus volume overload. Interstitial lung disease, pulmonary fibrosis per history, most recent CT chest from 04/19/2018 reviewed, does not appear that disease has advanced significantly since previous CT about 9 years ago.  - Continue diuresis, wean steroids. - Continue nebulized medicine. -Wean down oxygen as tolerated, advance activity as tolerated. -No need for further work-up or interventions in regards to the patient's history of interstitial lung disease. - Follow-up outpatient for further management of COPD.     Date: 04/28/2018  MRN# [id number] Kyle Frederick 1931-01-23   Kyle Frederick is a 84 y.o. old male seen in follow up for chief complaint of  Chief Complaint  Patient presents with  . Shortness of Breath     HPI:  Patient is awake and alert, he has no new complaints today.  He notes that his breathing is significantly improved since yesterday, he is currently sitting up in a chair, speaking in full sentences without conversational dyspnea.  Imaging personally reviewed, CT chest 04/19/2018, and comparison with previous on 06/19/2009, bibasilar interstitial changes have advanced mildly, otherwise, left diaphragm is more elevated than previous.  Medication:    Current Facility-Administered Medications:  .  acetaminophen (TYLENOL) tablet 650 mg, 650 mg, Oral, Q6H PRN **OR** acetaminophen (TYLENOL) suppository 650 mg, 650 mg, Rectal, Q6H PRN, Marjie Skiff, Prasanna, MD .  aspirin tablet 325 mg, 325 mg, Oral, Daily, Mody, Sital, MD, 325 mg at 04/28/18 0934 .  bisacodyl (DULCOLAX) EC tablet 5 mg, 5 mg, Oral, Daily PRN, Marjie Skiff, Prasanna, MD .  chlorhexidine (PERIDEX) 0.12 % solution 15 mL, 15 mL,  Mouth Rinse, BID, Tukov-Yual, Magdalene S, NP, 15 mL at 04/27/18 0820 .  enoxaparin (LOVENOX) injection 40 mg, 40 mg, Subcutaneous, Q24H, Marjie Skiff, Prasanna, MD, 40 mg at 04/28/18 0653 .  Gerhardt's butt cream, , Topical, TID, Mody, Sital, MD .  ipratropium-albuterol (DUONEB) 0.5-2.5 (3) MG/3ML nebulizer solution 3 mL, 3 mL, Nebulization, Q6H, Mody, Sital, MD, 3 mL at 04/28/18 1319 .  MEDLINE mouth rinse, 15 mL, Mouth Rinse, q12n4p, Tukov-Yual, Magdalene S, NP, 15 mL at 04/28/18 0107 .  mometasone-formoterol (DULERA) 100-5 MCG/ACT inhaler 2 puff, 2 puff, Inhalation, BID, Adrian Saran, MD, 2 puff at 04/28/18 0934 .  ondansetron (ZOFRAN) tablet 4 mg, 4 mg, Oral, Q6H PRN **OR** ondansetron (ZOFRAN) injection 4 mg, 4 mg, Intravenous, Q6H PRN, Barbaraann Rondo, MD, 4 mg at 04/26/18 0925 .  patiromer Lelon Perla) packet 8.4 g, 8.4 g, Oral, Daily, Lateef, Munsoor, MD, 8.4 g at 04/28/18 0106 .  [COMPLETED] methylPREDNISolone sodium succinate (SOLU-MEDROL) 125 mg/2 mL injection 60 mg, 60 mg, Intravenous, Q12H, 60 mg at 04/25/18 2046 **FOLLOWED BY** predniSONE (DELTASONE) tablet 40 mg, 40 mg, Oral, Q breakfast, Marjie Skiff, Prasanna, MD, 40 mg at 04/28/18 0934 .  senna-docusate (Senokot-S) tablet 1 tablet, 1 tablet, Oral, QHS PRN, Marjie Skiff, Prasanna, MD .  simvastatin (ZOCOR) tablet 20 mg, 20 mg, Oral, Daily, Marjie Skiff, Prasanna, MD, 20 mg at 04/27/18 1738   Allergies:  Lasix [furosemide]  Review of Systems: Gen:  Denies  fever, sweats. HEENT: Denies blurred vision. Cvc:  No dizziness, chest pain or heaviness Other:  All other systems were reviewed and found to be negative other than what is mentioned in the HPI.   Physical Examination:   VS: BP Marland Kitchen)  126/59   Pulse (!) 106   Temp 97.8 F (36.6 C) (Oral)   Resp 20   Ht  (1.753 m)   Wt 256 lb 9.6 oz (116.4 kg)   SpO2 94%   BMI 37.89 kg/m    General Appearance: No distress, looking better today.   Neuro:without focal findings,  speech  normal,  HEENT: PERRLA, EOM intact. Pulmonary: normal breath sounds, No wheezing.   CardiovascularNormal S1,S2.  No m/r/g.   Abdomen: Benign, Soft, non-tender. Renal:  No costovertebral tenderness  GU:  Not performed at this time. Endoc: No evident thyromegaly, no signs of acromegaly. Skin:   warm, no rash. Extremities: normal, no cyanosis, clubbing.   LABORATORY PANEL:   CBC Recent Labs  Lab 04/28/18 0257  WBC 12.0*  HGB 10.3*  HCT 32.8*  PLT 174   ------------------------------------------------------------------------------------------------------------------  Chemistries  Recent Labs  Lab 04/25/18 0028  04/28/18 0257  NA 140   < > 137  K 4.9   < > 4.6  CL 98*   < > 100*  CO2 37*   < > 29  GLUCOSE 97   < > 122*  BUN 33*   < > 52*  CREATININE 1.44*   < > 1.60*  CALCIUM 9.5   < > 9.3  AST 18  --   --   ALT 10*  --   --   ALKPHOS 49  --   --   BILITOT 0.4  --   --    < > = values in this interval not displayed.   ------------------------------------------------------------------------------------------------------------------  Cardiac Enzymes Recent Labs  Lab 04/25/18 0028  TROPONINI <0.03   ------------------------------------------------------------  RADIOLOGY:   No results found for this or any previous visit. No results found for this or any previous visit. ------------------------------------------------------------------------------------------------------------------  Thank  you for allowing [facility] to assist in the care of your patient. Our recommendations are noted above.  Please contact us if we can be of further service.   Wells Guiles, MD.  [facility] Office Number: [phone]  Santiago Glad, M.D.  Billy Fischer, M.D  04/28/2018

## 2018-04-28 NOTE — Progress Notes (Signed)
[facility] - [place] at [facility]   PATIENT NAME: Kyle Frederick    MR#:  [id number]  DATE OF BIRTH:  [birth year]  SUBJECTIVE:   No acute issues reported overnight  REVIEW OF SYSTEMS:    Review of Systems  Constitutional: Negative for fever, chills weight loss HENT: Negative for ear pain, nosebleeds, congestion, facial swelling, rhinorrhea, neck pain, neck stiffness and ear discharge.   Respiratory: Denies cough, denies shortness of breath, NO wheezing  Cardiovascular: Negative for chest pain, palpitations and ++leg swelling.  Gastrointestinal: Negative for heartburn, abdominal pain, vomiting, diarrhea or consitpation Genitourinary: Negative for dysuria, urgency, frequency, hematuria Musculoskeletal: Negative for back pain or joint pain Neurological: Negative for dizziness, seizures, syncope, focal weakness,  numbness and headaches.  Hematological: Does not bruise/bleed easily.  Psychiatric/Behavioral: Negative for hallucinations,  no dysphoric mood  ++confusion at baseline  Tolerating Diet: yes      DRUG ALLERGIES:   Allergies  Allergen Reactions  . Lasix [Furosemide] Other (See Comments)    "syncope"    VITALS:  Blood pressure (!) 126/59, pulse (!) 57, temperature 97.8 F (36.6 C), temperature source Oral, resp. rate 20, height  (1.753 m), weight 116.4 kg (256 lb 9.6 oz), SpO2 99 %.  PHYSICAL EXAMINATION:  Constitutional: Appears well-developed and well-nourished. No distress.obese HENT: Normocephalic. Marland Kitchen Oropharynx is clear and moist.  Eyes: Conjunctivae and EOM are normal. PERRLA, no scleral icterus.  Neck: Normal ROM. Neck supple. No JVD. No tracheal deviation. CVS: RRR, S1/S2 +,+ murmurs, no gallops, no carotid bruit.  Pulmonary: diminished breath sounds, inspiratory fine crackles lower base abdominal: Soft. BS +,  no distension, tenderness, rebound or guarding. obese Musculoskeletal: Normal range of motion. 3++ LEE and no tenderness.   Neuro: Alert. CN 2-12 grossly intact. No focal deficits. Skin: Skin is warm and dry. No rash noted. Psychiatric: Confused oriented to name and place not time    LABORATORY PANEL:   CBC Recent Labs  Lab 04/28/18 0257  WBC 12.0*  HGB 10.3*  HCT 32.8*  PLT 174   ------------------------------------------------------------------------------------------------------------------  Chemistries  Recent Labs  Lab 04/25/18 0028  04/28/18 0257  NA 140   < > 137  K 4.9   < > 4.6  CL 98*   < > 100*  CO2 37*   < > 29  GLUCOSE 97   < > 122*  BUN 33*   < > 52*  CREATININE 1.44*   < > 1.60*  CALCIUM 9.5   < > 9.3  AST 18  --   --   ALT 10*  --   --   ALKPHOS 49  --   --   BILITOT 0.4  --   --    < > = values in this interval not displayed.   ------------------------------------------------------------------------------------------------------------------  Cardiac Enzymes Recent Labs  Lab 04/25/18 0028  TROPONINI <0.03   ------------------------------------------------------------------------------------------------------------------  RADIOLOGY:  Ct Chest Wo Contrast  Result Date: 04/27/2018 CLINICAL DATA:  Acute on chronic hypoxic respiratory failure. Volume overload with pleural effusions. Wheezing. History of pulmonary fibrosis. EXAM: CT CHEST WITHOUT CONTRAST TECHNIQUE: Multidetector CT imaging of the chest was performed following the standard protocol without IV contrast. COMPARISON:  Radiography 04/25/2018. Radiography 10/28/2012. CT 06/19/2009. FINDINGS: Cardiovascular: The heart is mildly enlarged. No pericardial fluid. There is extensive coronary artery calcification. There is aortic atherosclerosis. Mediastinum/Nodes: No mediastinal mass or lymphadenopathy. Lungs/Pleura: There is no pleural effusion. There is no lobar collapse. No sign of consolidation. There is scarring and  there are changes of bronchiectasis within each lobe. Compared to the study 2010, the findings are  remarkably similar with only mild progression. Upper Abdomen: Chololithiasis without CT evidence of cholecystitis. Musculoskeletal: Ordinary spinal degenerative changes. IMPRESSION: No pleural effusion.  No atelectasis.  No active infiltrate. Chronic bronchiectasis and pulmonary scarring, remarkably similar to the prior study of 06/19/2009. Mild cardiomegaly. Extensive coronary artery calcification. Aortic atherosclerosis. Electronically Signed   By: Paulina Fusi M.D.   On: 04/27/2018 16:05     ASSESSMENT AND PLAN:   84 year old male with history of cardiomegaly and COPD who presents with cough and shortness of breath   1.  Acute hypoxic and hypercarbic respiratory failure with history of interstitial lung disease and COPD Patient weaned from BiPAP to 2 L nasal cannula.  He will need to continue BiPAP at night as per recommendations by pulmonary while in the hospital.   2.  Acute exacerbation of COPD: Continue bronchodilators, steroids and Dulera  continue azithromycin for 1 more day for acute bronchitis resulting in acute exacerbation of COPD  3.  Acute on chronic diastolic heart failure with anasarca: Cardiology consultation appreciated  CHF clinic upon discharge  Patient euvolemic  Would be careful with diuresis as kidney function is very tenuous.   He will not have Lasix upon discharge  Intake and output with daily weight should be carefully monitored. Patient will follow-up with cardiology upon discharge 4.  Essential hypertension: He is currently off of blood pressure medications and his blood pressure is stable.   5.  Hyperlipidemia: Continue statin  6.  History of interstitial lung disease: Patient is diabetic pulmonary.  CT chest shows chronic bronchitis but no effusion or pneumonia.   He would benefit from outpatient sleep evaluation.   7.  History of CVA with confusion at baseline Aspirin and statin 8.  Acute on chronic kidney disease stage III: His creatinine was  increased due to overdiuresis.  His creatinine is at baseline.  He will follow-up with nephrology as an outpatient  9.  Hyperkalemia: This was treated and has resolved.  Management plans discussed with the patient and family and they are in agreement.  CODE STATUS:DNR TOTAL TIME TAKING CARE OF THIS PATIENT: 24 minutes.  Awaiting PT consult for discharge planning   POSSIBLE D/C today, DEPENDING ON physical therapy consultation.   Perina Salvaggio M.D on 04/28/2018 at 8:32 AM  Between 7am to 6pm - Pager - [phone] After 6pm go to [email or web address] - password [email or web address]  [facility] [place]  Office  [phone]  CC: Primary care physician; Gracelyn Nurse, MD  Note: This dictation was prepared with Dragon dictation along with smaller phrase technology. Any transcriptional errors that result from this process are unintentional.

## 2018-04-28 NOTE — Care Management Note (Addendum)
Case Management Note  Patient Details  Name: Kyle Frederick MRN: [id number] Date of Birth: [birth year]  Subjective/Objective: Spoke with Kyle Frederick about home health.  She is agreeable. Offered choice of home health agencies. [facility] to [facility] for walker, RN, PT, SW, OT  and HHA. Kyle Frederick declined the need for any other services. She will be staying with patient during the day. Left her a private duty nursing list in the room in patient belongings. Medical necessity completed for EMS transport..                    Action/Plan:   Expected Discharge Date:  04/28/18               Expected Discharge Plan:  Home w Home Health Services  In-House Referral:  Clinical Social Work  Discharge planning Services  CM Consult  Post Acute Care Choice:  Home Health Choice offered to:  Adult Children  DME Arranged:    DME Agency:     HH Arranged:  RN, PT, Nurse's Aide HH Agency:  [facility]  Status of Service:  Completed, signed off  If discussed at Long Length of Stay Meetings, dates discussed:    Additional Comments:  [name], RN 04/28/2018, 12:57 PM

## 2018-04-28 NOTE — Progress Notes (Signed)
EMS here to take pt home. Kyle Frederick was notified.

## 2018-04-28 NOTE — Progress Notes (Signed)
Pt agreed to place BIPAP. [facility] BIPAP plugged into red outlet with 2L O2 in line

## 2018-04-28 NOTE — Discharge Summary (Addendum)
[facility] - [place] at [facility]   PATIENT NAME: Kyle Frederick    MR#:  [id number]  DATE OF BIRTH:  06/05/1931  DATE OF ADMISSION:  04/25/2018 ADMITTING PHYSICIAN: Barbaraann Rondo, MD  DATE OF DISCHARGE: 04/28/2018  PRIMARY CARE PHYSICIAN: Gracelyn Nurse, MD    ADMISSION DIAGNOSIS:  COPD exacerbation (HCC) [J44.1] Acute respiratory failure with hypercapnia (HCC) [J96.02]  DISCHARGE DIAGNOSIS:  Active Problems:   Acute exacerbation of chronic obstructive pulmonary disease (COPD) (HCC)    SECONDARY DIAGNOSIS:   Past Medical History:  Diagnosis Date  . CHF (congestive heart failure) (HCC)   . COPD (chronic obstructive pulmonary disease) [facility])     HOSPITAL COURSE:   84 year old male with history of cardiomegaly and COPD who presents with cough and shortness of breath   1.  Acute hypoxic and hypercarbic respiratory failure with history of interstitial lung disease and COPD Patient weaned from BiPAP to 2 L nasal cannula.   He was on BiPAP at night while in the hospital.   2.  Acute exacerbation of COPD: Continue bronchodilators, steroids and Dulera    3.  Acute on chronic diastolic heart failure with anasarca: Cardiology consultation appreciated  CHF clinic upon discharge  Patient euvolemic  Would be careful with diuresis as kidney function is very tenuous.   He will not have Lasix upon discharge  Intake and output with daily weight should be carefully monitored. Patient will follow-up with cardiology upon discharge  4.  Essential hypertension: He is currently off of blood pressure medications and his blood pressure is stable.   5.  Hyperlipidemia: Continue statin  6.  History of interstitial lung disease: Patient is diabetic pulmonary.  CT chest shows chronic bronchitis but no effusion or pneumonia.   He would benefit from outpatient sleep evaluation.   7.  History of CVA with confusion at baseline Aspirin and statin 8.   Acute on chronic kidney disease stage III: His creatinine was increased due to overdiuresis.  His creatinine is at baseline.  He will follow-up with nephrology as an outpatient  9.  Hyperkalemia: This was treated and has resolved.  10.  Lower extremity edema due to combination of diastolic heart failure as well as chronic lymphedema he will need Unna boots changed weekly.   DISCHARGE CONDITIONS AND DIET:   stAble for discharge on heart healthy diet  CONSULTS OBTAINED:  Treatment Team:  Barbaraann Rondo, MD [facility], Raymond Gurney, MD Mady Haagensen, MD Alwyn Pea, MD  DRUG ALLERGIES:   Allergies  Allergen Reactions  . Lasix [Furosemide] Other (See Comments)    "syncope"    DISCHARGE MEDICATIONS:   Allergies as of 04/28/2018      Reactions   Lasix [furosemide] Other (See Comments)   "syncope"      Medication List    STOP taking these medications   lisinopril-hydrochlorothiazide 20-25 MG tablet Commonly known as:  PRINZIDE,ZESTORETIC     TAKE these medications   acetaminophen 650 MG CR tablet Commonly known as:  TYLENOL Take 2 tablets by mouth every 8 (eight) hours as needed.   aspirin 81 MG EC tablet Take 1 tablet by mouth daily.   Gerhardt's butt cream Crea Apply 1 application topically 3 (three) times daily for 7 days.   ipratropium-albuterol 0.5-2.5 (3) MG/3ML Soln Commonly known as:  DUONEB Take 3 mLs by nebulization every 6 (six) hours as needed (sob wheezing).   mometasone-formoterol 100-5 MCG/ACT Aero Commonly known as:  DULERA Inhale 2 puffs into the lungs  2 (two) times daily.   polyethylene glycol packet Commonly known as:  MIRALAX / GLYCOLAX Take 1 packet by mouth daily as needed.   predniSONE 20 MG tablet Commonly known as:  DELTASONE Take 2 tablets (40 mg total) by mouth daily with breakfast for 2 days. Start taking on:  04/29/2018   simvastatin 20 MG tablet Commonly known as:  ZOCOR Take 1 tablet by mouth daily.          Today   CHIEF COMPLAINT:  Doing better this am   VITAL SIGNS:  Blood pressure (!) 126/59, pulse (!) 106, temperature 97.8 F (36.6 C), temperature source Oral, resp. rate 20, height  (1.753 m), weight 116.4 kg (256 lb 9.6 oz), SpO2 94 %.   REVIEW OF SYSTEMS:  Review of Systems  Constitutional: Negative.  Negative for chills, fever and malaise/fatigue.  HENT: Negative.  Negative for ear discharge, ear pain, hearing loss, nosebleeds and sore throat.   Eyes: Negative.  Negative for blurred vision and pain.  Respiratory: Negative.  Negative for cough, hemoptysis, shortness of breath and wheezing.   Cardiovascular: Positive for leg swelling. Negative for chest pain and palpitations.  Gastrointestinal: Negative.  Negative for abdominal pain, blood in stool, diarrhea, nausea and vomiting.  Genitourinary: Negative.  Negative for dysuria.  Musculoskeletal: Negative.  Negative for back pain.  Skin: Negative.   Neurological: Negative for dizziness, tremors, speech change, focal weakness, seizures and headaches.  Endo/Heme/Allergies: Negative.  Does not bruise/bleed easily.  Psychiatric/Behavioral: Negative.  Negative for depression, hallucinations and suicidal ideas.     PHYSICAL EXAMINATION:  GENERAL:  84 y.o.-year-old patient lying in the bed with no acute distress. obese NECK:  Supple, no jugular venous distention. No thyroid enlargement, no tenderness.  LUNGS: Normal breath sounds bilaterally, no wheezing, rales,rhonchi  No use of accessory muscles of respiration.  CARDIOVASCULAR: S1, S2 normal. No murmurs, rubs, or gallops.  ABDOMEN: Soft, non-tender, non-distended. Bowel sounds present. No organomegaly or mass.  EXTREMITIES: 4+ LEE NO cyanosis, or clubbing.  PSYCHIATRIC: The patient is alert and oriented x 3.  SKIN: No obvious rash, lesion, or ulcer.   DATA REVIEW:   CBC Recent Labs  Lab 04/28/18 0257  WBC 12.0*  HGB 10.3*  HCT 32.8*  PLT 174     Chemistries  Recent Labs  Lab 04/25/18 0028  04/28/18 0257  NA 140   < > 137  K 4.9   < > 4.6  CL 98*   < > 100*  CO2 37*   < > 29  GLUCOSE 97   < > 122*  BUN 33*   < > 52*  CREATININE 1.44*   < > 1.60*  CALCIUM 9.5   < > 9.3  AST 18  --   --   ALT 10*  --   --   ALKPHOS 49  --   --   BILITOT 0.4  --   --    < > = values in this interval not displayed.    Cardiac Enzymes Recent Labs  Lab 04/25/18 0028  TROPONINI <0.03    Microbiology Results  @  RADIOLOGY:  Ct Chest Wo Contrast  Result Date: 04/27/2018 CLINICAL DATA:  Acute on chronic hypoxic respiratory failure. Volume overload with pleural effusions. Wheezing. History of pulmonary fibrosis. EXAM: CT CHEST WITHOUT CONTRAST TECHNIQUE: Multidetector CT imaging of the chest was performed following the standard protocol without IV contrast. COMPARISON:  Radiography 04/25/2018. Radiography 10/28/2012. CT 06/19/2009. FINDINGS: Cardiovascular: The heart is  mildly enlarged. No pericardial fluid. There is extensive coronary artery calcification. There is aortic atherosclerosis. Mediastinum/Nodes: No mediastinal mass or lymphadenopathy. Lungs/Pleura: There is no pleural effusion. There is no lobar collapse. No sign of consolidation. There is scarring and there are changes of bronchiectasis within each lobe. Compared to the study 2010, the findings are remarkably similar with only mild progression. Upper Abdomen: Chololithiasis without CT evidence of cholecystitis. Musculoskeletal: Ordinary spinal degenerative changes. IMPRESSION: No pleural effusion.  No atelectasis.  No active infiltrate. Chronic bronchiectasis and pulmonary scarring, remarkably similar to the prior study of 06/19/2009. Mild cardiomegaly. Extensive coronary artery calcification. Aortic atherosclerosis. Electronically Signed   By: Paulina Fusi M.D.   On: 04/27/2018 16:05   US Renal  Result Date: 04/28/2018 CLINICAL DATA:  Acute renal failure EXAM: RENAL  / URINARY TRACT ULTRASOUND COMPLETE COMPARISON:  07/01/2013 abdominal CT FINDINGS: Right Kidney: Length: 11 cm. Cortical thinning to approximately 12 mm. Diffusely increased cortical echogenicity. No mass or hydronephrosis visualized. Left Kidney: Length: 12 cm. Symmetric cortical thinning, generalized. No solid mass or hydronephrosis visualized. Simple 2 cm lower pole cyst Bladder: Enlarged prostate with mild circumferential bladder wall thickening. IMPRESSION: 1. Medical renal disease with mild generalized cortical thinning. 2. No hydronephrosis. 3. Chronic prostate enlargement and bladder wall thickening. Electronically Signed   By: Marnee Spring M.D.   On: 04/28/2018 09:16      Allergies as of 04/28/2018      Reactions   Lasix [furosemide] Other (See Comments)   "syncope"      Medication List    STOP taking these medications   lisinopril-hydrochlorothiazide 20-25 MG tablet Commonly known as:  PRINZIDE,ZESTORETIC     TAKE these medications   acetaminophen 650 MG CR tablet Commonly known as:  TYLENOL Take 2 tablets by mouth every 8 (eight) hours as needed.   aspirin 81 MG EC tablet Take 1 tablet by mouth daily.   Gerhardt's butt cream Crea Apply 1 application topically 3 (three) times daily for 7 days.   ipratropium-albuterol 0.5-2.5 (3) MG/3ML Soln Commonly known as:  DUONEB Take 3 mLs by nebulization every 6 (six) hours as needed (sob wheezing).   mometasone-formoterol 100-5 MCG/ACT Aero Commonly known as:  DULERA Inhale 2 puffs into the lungs 2 (two) times daily.   polyethylene glycol packet Commonly known as:  MIRALAX / GLYCOLAX Take 1 packet by mouth daily as needed.   predniSONE 20 MG tablet Commonly known as:  DELTASONE Take 2 tablets (40 mg total) by mouth daily with breakfast for 2 days. Start taking on:  04/29/2018   simvastatin 20 MG tablet Commonly known as:  ZOCOR Take 1 tablet by mouth daily.           Management plans discussed with the patient  and he is in agreement. Stable for discharge   Patient should follow up with pcp  CODE STATUS:     Code Status Orders  (From admission, onward)        Start     Ordered   04/26/18 0858  Do not attempt resuscitation (DNR)  Continuous    Question Answer Comment  In the event of cardiac or respiratory ARREST Do not call a "code blue"   In the event of cardiac or respiratory ARREST Do not perform Intubation, CPR, defibrillation or ACLS   In the event of cardiac or respiratory ARREST Use medication by any route, position, wound care, and other measures to relive pain and suffering. May use oxygen, suction and manual  treatment of airway obstruction as needed for comfort.      04/26/18 0857    Code Status History    Date Active Date Inactive Code Status Order ID Comments User Context   04/25/2018 0324 04/26/2018 0857 Full Code [id number]  Barbaraann Rondo, MD Inpatient      TOTAL TIME TAKING CARE OF THIS PATIENT: 38 minutes.    Note: This dictation was prepared with Dragon dictation along with smaller phrase technology. Any transcriptional errors that result from this process are unintentional.  Abhijot Straughter M.D on 04/28/2018 at 11:22 AM  Between 7am to 6pm - Pager - [phone] After 6pm go to [email or web address] - password [email or web address]  [facility] [place] [facility]  Office  [phone]  CC: Primary care physician; Gracelyn Nurse, MD

## 2018-04-28 NOTE — Evaluation (Signed)
Physical Therapy Evaluation Patient Details Name: Kyle Frederick MRN: [id number] DOB: 04-12-1931 Today's Date: 04/28/2018   History of Present Illness  Pt is an86 y.o.malewith a known history of COPD presents with SOB. Pt is on BiPAP.  Son went to pt's house, found pt to be in acute distress, called EMS. (+) cough, (+) conversational dyspnea. SOB apparently became so severe pt was unable to speak. Started on CPAP by EMS. Denies CP.  Pt with B/L LE edema, chronic x24mo+.   Assessment includes: Acute hypoxic and hypercarbic respiratory failure with history of interstitial lung disease and COPD, acute on chronic diastolic CHF with anasarca, HTN, HLD, acute on chronic kidney disease stage III, hyperkalemia, and h/o CVA.      Clinical Impression  Pt presents with minor deficits in strength, transfers, and gait and with moderate deficits in activity tolerance.  Pt was mod Ind with bed mobility tasks with the use of the rail and SBA during sit to/from stand transfers from various height surfaces with good concentric and eccentric control.  Pt able to amb 1 x 15', 1 x 100', and 1 x 120' with a RW and with extensive verbal and tactile cues for amb closer to the RW, upright posture, and general sequencing during amb with poor carry over.  Pt ambulated with flexed trunk posture and short B step length but was steady without LOB.  Pt's SpO2 on 2LO2/min remained >/= 94% throughout the session with HR increasing to a max of 138 bpm after amb 120' from baseline of 106 bpm but returned back to baseline quickly upon sitting.  Pt reported no adverse symptoms during the session other than mild fatigue. Pt will benefit from HHPT services upon discharge to safely address above deficits for decreased risk of further functional decline and decreased fall risk.  Secondary to cognitive deficits noted during session pt would benefit from 24/hr supervision and would be appropriate for ALF/memory care setting if 24/hr assist is  not available in the home setting.        Follow Up Recommendations Home health PT;Supervision/Assistance - 24 hour;Other (comment)    Equipment Recommendations  Rolling walker with 5" wheels    Recommendations for Other Services       Precautions / Restrictions Precautions Precautions: Fall Restrictions Weight Bearing Restrictions: No      Mobility  Bed Mobility Overal bed mobility: Modified Independent             General bed mobility comments: Pt mod Ind with bed mobility tasks with use of rail  Transfers Overall transfer level: Needs assistance Equipment used: Rolling walker (2 wheeled) Transfers: Sit to/from Stand Sit to Stand: Supervision         General transfer comment: Good eccentric and concentric control during sit to/from stand transfers from multiple surfaces  Ambulation/Gait Ambulation/Gait assistance: Supervision;Min guard Ambulation Distance (Feet): 120 Feet Assistive device: Rolling walker (2 wheeled) Gait Pattern/deviations: Step-through pattern;Decreased step length - right;Decreased step length - left;Trunk flexed Gait velocity: Decreased   General Gait Details: Mod to max verbal cues for amb closer to RW with upright posture; pt with poor carry over regarding sequencing with the RW but was steady without LOB  Stairs            Wheelchair Mobility    Modified Rankin (Stroke Patients Only)       Balance Overall balance assessment: No apparent balance deficits (not formally assessed)  Pertinent Vitals/Pain Pain Assessment: No/denies pain    Home Living Family/patient expects to be discharged to:: Private residence(History from step-daughter Debbie at bedside) Living Arrangements: Alone Available Help at Discharge: Available PRN/intermittently;Family Type of Home: Mobile home Home Access: Ramped entrance     Home Layout: One level Home Equipment: None       Prior Function Level of Independence: Needs assistance   Gait / Transfers Assistance Needed: Ind amb household distances without AD, 1 fall in the last 6 months, Ind with ADLs  ADL's / Homemaking Assistance Needed: Step-daughter assists with errands, grocery shopping, and housework as well as arranging for transport as needed        Hand Dominance   Dominant Hand: Right    Extremity/Trunk Assessment   Upper Extremity Assessment Upper Extremity Assessment: Overall WFL for tasks assessed    Lower Extremity Assessment Lower Extremity Assessment: Generalized weakness       Communication   Communication: No difficulties  Cognition Arousal/Alertness: Awake/alert Behavior During Therapy: Flat affect Overall Cognitive Status: History of cognitive impairments - at baseline                                 General Comments: Step-daughter reports pt has shown significant decline in cognition over the last 6 months; pt was able to follow commands but did show significant confusion at times regarding orientation.      General Comments      Exercises     Assessment/Plan    PT Assessment Patient needs continued PT services  PT Problem List Decreased strength;Decreased activity tolerance;Decreased knowledge of use of DME;Decreased safety awareness       PT Treatment Interventions DME instruction;Gait training;Functional mobility training;Balance training;Therapeutic exercise;Therapeutic activities;Patient/family education    PT Goals (Current goals can be found in the Care Plan section)  Acute Rehab PT Goals PT Goal Formulation: Patient unable to participate in goal setting Time For Goal Achievement: 05/11/18 Potential to Achieve Goals: Good    Frequency Min 2X/week   Barriers to discharge        Co-evaluation               AM-PAC PT "6 Clicks" Daily Activity  Outcome Measure Difficulty turning over in bed (including adjusting bedclothes, sheets  and blankets)?: None Difficulty moving from lying on back to sitting on the side of the bed? : None Difficulty sitting down on and standing up from a chair with arms (e.g., wheelchair, bedside commode, etc,.)?: None Help needed moving to and from a bed to chair (including a wheelchair)?: None Help needed walking in hospital room?: None Help needed climbing 3-5 steps with a railing? : A Little 6 Click Score: 23    End of Session Equipment Utilized During Treatment: Gait belt;Oxygen Activity Tolerance: Patient tolerated treatment well Patient left: in chair;with chair alarm set;with call bell/phone within reach;with family/visitor present Nurse Communication: Mobility status PT Visit Diagnosis: Muscle weakness (generalized) (M62.81);Difficulty in walking, not elsewhere classified (R26.2)    Time: 3086-5784 PT Time Calculation (min) (ACUTE ONLY): 43 min   Charges:   PT Evaluation $PT Eval Low Complexity: 1 Low PT Treatments $Gait Training: 8-22 mins   PT G Codes:        DElly Modena PT, DPT 04/28/18, 11:27 AM

## 2018-04-28 NOTE — Care Management Important Message (Signed)
Copy of signed IM left in patient's room.    

## 2018-04-28 NOTE — Progress Notes (Signed)
[facility]  ROUNDING NOTE   Subjective:  Patient significantly improved. Creatinine down to 1.6. Renal ultrasound was negative for hydronephrosis. Patient was visualized ambulating on the wards today and appeared to be doing well.   Objective:  Vital signs in last 24 hours:  Temp:  [97.8 F (36.6 C)-98.4 F (36.9 C)] 97.8 F (36.6 C) (05/29 0518) Pulse Rate:  [56-106] 106 (05/29 1112) Resp:  [18-20] 20 (05/29 0715) BP: (112-137)/(50-63) 126/59 (05/29 0715) SpO2:  [94 %-100 %] 94 % (05/29 1112) Weight:  [116.4 kg (256 lb 9.6 oz)] 116.4 kg (256 lb 9.6 oz) (05/29 0520)  Weight change: 0.181 kg (6.4 oz) Filed Weights   04/25/18 1900 04/27/18 0404 04/28/18 0520  Weight: 114.4 kg (252 lb 3.3 oz) 116.2 kg (256 lb 3.2 oz) 116.4 kg (256 lb 9.6 oz)    Intake/Output: I/O last 3 completed shifts: In: 240 [P.O.:240] Out: 2600 [Urine:2600]   Intake/Output this shift:  Total I/O In: -  Out: 450 [Urine:450]  Physical Exam: General: No acute distress  Head: Normocephalic, atraumatic. Moist oral mucosal membranes  Eyes: Anicteric  Neck: Supple, trachea midline  Lungs:  Scattered rhonchi, normal effort  Heart: S1S2 no rubs  Abdomen:  Soft, nontender, bowel sounds present  Extremities: 2+ peripheral edema.  Neurologic: Awake, alert, following commands  Skin: No rash       Basic Metabolic Panel: Recent Labs  Lab 04/25/18 0028 04/26/18 0439 04/27/18 0634 04/28/18 0257  NA 140 138 137 137  K 4.9 4.7 5.6* 4.6  CL 98* 99* 104 100*  CO2 37* GLUCOSE 97 145* 108* 122*  BUN 33* 40* 51* 52*  CREATININE 1.44* 1.50* 2.00* 1.60*  CALCIUM 9.5 9.5 9.2 9.3  PHOS  --  2.6  --   --     Liver Function Tests: Recent Labs  Lab 04/25/18 0028 04/26/18 0439  AST 18  --   ALT 10*  --   ALKPHOS 49  --   BILITOT 0.4  --   PROT 7.7  --   ALBUMIN 4.0 4.0   No results for input(s): LIPASE, AMYLASE in the last 168 hours. No results for input(s): AMMONIA in the  last 168 hours.  CBC: Recent Labs  Lab 04/25/18 0028 04/28/18 0257  WBC 6.3 12.0*  NEUTROABS 4.1  --   HGB 9.8* 10.3*  HCT 31.1* 32.8*  MCV 82.2 82.8  PLT 185 174    Cardiac Enzymes: Recent Labs  Lab 04/25/18 0028  TROPONINI <0.03    BNP: Invalid input(s): POCBNP  CBG: Recent Labs  Lab 04/25/18 0322  GLUCAP 88    Microbiology: Results for orders placed or performed during the hospital encounter of 04/25/18  MRSA PCR Screening     Status: None   Collection Time: 04/25/18  4:00 AM  Result Value Ref Range Status   MRSA by PCR NEGATIVE NEGATIVE Final    Comment:        The GeneXpert MRSA Assay (FDA approved for NASAL specimens only), is one component of a comprehensive MRSA colonization surveillance program. It is not intended to diagnose MRSA infection nor to guide or monitor treatment for MRSA infections. Performed at [facility], [address] [place], [place] [address]     Coagulation Studies: No results for input(s): LABPROT, INR in the last 72 hours.  Urinalysis: No results for input(s): COLORURINE, LABSPEC, PHURINE, GLUCOSEU, HGBUR, BILIRUBINUR, KETONESUR, PROTEINUR, UROBILINOGEN, NITRITE, LEUKOCYTESUR in the last 72 hours.  Invalid  input(s): APPERANCEUR    Imaging: Ct Chest Wo Contrast  Result Date: 04/27/2018 CLINICAL DATA:  Acute on chronic hypoxic respiratory failure. Volume overload with pleural effusions. Wheezing. History of pulmonary fibrosis. EXAM: CT CHEST WITHOUT CONTRAST TECHNIQUE: Multidetector CT imaging of the chest was performed following the standard protocol without IV contrast. COMPARISON:  Radiography 04/25/2018. Radiography 10/28/2012. CT 06/19/2009. FINDINGS: Cardiovascular: The heart is mildly enlarged. No pericardial fluid. There is extensive coronary artery calcification. There is aortic atherosclerosis. Mediastinum/Nodes: No mediastinal mass or lymphadenopathy. Lungs/Pleura: There is no pleural effusion. There  is no lobar collapse. No sign of consolidation. There is scarring and there are changes of bronchiectasis within each lobe. Compared to the study 2010, the findings are remarkably similar with only mild progression. Upper Abdomen: Chololithiasis without CT evidence of cholecystitis. Musculoskeletal: Ordinary spinal degenerative changes. IMPRESSION: No pleural effusion.  No atelectasis.  No active infiltrate. Chronic bronchiectasis and pulmonary scarring, remarkably similar to the prior study of 06/19/2009. Mild cardiomegaly. Extensive coronary artery calcification. Aortic atherosclerosis. Electronically Signed   By: Paulina Fusi M.D.   On: 04/27/2018 16:05   US Renal  Result Date: 04/28/2018 CLINICAL DATA:  Acute renal failure EXAM: RENAL / URINARY TRACT ULTRASOUND COMPLETE COMPARISON:  07/01/2013 abdominal CT FINDINGS: Right Kidney: Length: 11 cm. Cortical thinning to approximately 12 mm. Diffusely increased cortical echogenicity. No mass or hydronephrosis visualized. Left Kidney: Length: 12 cm. Symmetric cortical thinning, generalized. No solid mass or hydronephrosis visualized. Simple 2 cm lower pole cyst Bladder: Enlarged prostate with mild circumferential bladder wall thickening. IMPRESSION: 1. Medical renal disease with mild generalized cortical thinning. 2. No hydronephrosis. 3. Chronic prostate enlargement and bladder wall thickening. Electronically Signed   By: Marnee Spring M.D.   On: 04/28/2018 09:16     Medications:    . aspirin  325 mg Oral Daily  . chlorhexidine  15 mL Mouth Rinse BID  . enoxaparin (LOVENOX) injection  40 mg Subcutaneous Q24H  . Gerhardt's butt cream   Topical TID  . ipratropium-albuterol  3 mL Nebulization Q6H  . mouth rinse  15 mL Mouth Rinse q12n4p  . mometasone-formoterol  2 puff Inhalation BID  . patiromer  8.4 g Oral Daily  . predniSONE  40 mg Oral Q breakfast  . simvastatin  20 mg Oral Daily   acetaminophen **OR** acetaminophen, bisacodyl, ondansetron  **OR** ondansetron (ZOFRAN) IV, senna-docusate  Assessment/ Plan:  84 y.o. male with a PMHx of COPD, prior CVA, interstitial lung disease, mild pulmonary hypertension, osteoarthritis, hisotry of congestive heart failure with normal ejection fraction, who was admitted to [facility] on 04/25/2018 for evaluation of shortness of breath.   1.  Acute renal failure, ? Secondary to diuresis. 2.  CKD stage III baseline Cr 1.4. 3.  Anemia of CKD. 4.  Hyperkalemia.    Plan:  Renal function has significantly improved.  Creatinine down to 1.6.  Potassium also has improved down to 4.6.  Renal ultrasound was negative for hydronephrosis but did demonstrate medical renal disease consistent with chronic kidney disease.  Patient will need follow-up for his underlying chronic kidney disease.  We discussed this with the patient's stepdaughter.  Disposition as per hospitalist.    LOS: 3 Chaniah Cisse 5/29/20192:16 PM

## 2018-04-28 NOTE — Progress Notes (Signed)
Discharge instructions were viewed with POA Debbie verbally. EMS called to transport pt. Tele removed. Pt has no further concerns at this time.

## 2018-04-29 LAB — PROTEIN ELECTRO, RANDOM URINE
ALBUMIN ELP UR: 33.2 %
ALPHA-1-GLOBULIN, U: 5.5 %
ALPHA-2-GLOBULIN, U: 17.8 %
Beta Globulin, U: 25.1 %
Gamma Globulin, U: 18.4 %
TOTAL PROTEIN, URINE-UPE24: 7.1 mg/dL

## 2018-04-29 LAB — ANA W/REFLEX IF POSITIVE: Anti Nuclear Antibody(ANA): NEGATIVE

## 2018-04-30 LAB — PROTEIN ELECTROPHORESIS, SERUM
A/G Ratio: 1.3 (ref 0.7–1.7)
ALBUMIN ELP: 3.6 g/dL (ref 2.9–4.4)
Alpha-1-Globulin: 0.2 g/dL (ref 0.0–0.4)
Alpha-2-Globulin: 0.7 g/dL (ref 0.4–1.0)
BETA GLOBULIN: 0.8 g/dL (ref 0.7–1.3)
GAMMA GLOBULIN: 0.9 g/dL (ref 0.4–1.8)
Globulin, Total: 2.7 g/dL (ref 2.2–3.9)
TOTAL PROTEIN ELP: 6.3 g/dL (ref 6.0–8.5)

## 2018-05-03 ENCOUNTER — Telehealth: Payer: Self-pay

## 2018-05-03 NOTE — Telephone Encounter (Signed)
Spoke with Mr. Charlesetta ShanksHollands daughter Eunice BlaseDebbie. She states that she likes the idea of coming to the clinic but they already have 5 appointments this month. Her father has to use dial a ride because he struggles getting in and out of the car. She may be open to scheduling something in July dependant on what happens with appointments for the other doctors. She states she will speak to his current doctors and call the office back. Our number was provided.

## 2018-05-03 NOTE — Telephone Encounter (Signed)
Called Mr. Kyle Frederick to make appointment per referral he states I will need to speak to his daughter Kyle Frederick. I will call her to set appointment.

## 2018-05-03 NOTE — Telephone Encounter (Signed)
Flagged on EMMI report for having unfilled prescriptions and not being sure if he received discharge papers. Called and spoke with patient's step-daughter, Eunice BlaseDebbie as patient was receiving physical therapy at time of call.   Per Eunice Blaseebbie, she does have his discharge papers and was able to fill the four medications discharged home on, though relayed home health told them not to take lisinopril since he was on simvastatin now.   Encouraged her to talk to patient's PCP regarding blood pressure medication questions.  No further issues at this time.  I thanked her for her time and informed her that they would receive one more automated call checking in on patient.

## 2018-05-03 NOTE — Telephone Encounter (Signed)
-----   Message from Delma Freezeina A Hackney, [place] sent at 04/29/2018  8:21 AM EDT ----- Regarding: please call Hospitalist made the referral

## 2018-05-05 ENCOUNTER — Encounter: Payer: Self-pay | Admitting: Internal Medicine

## 2018-05-05 ENCOUNTER — Ambulatory Visit (INDEPENDENT_AMBULATORY_CARE_PROVIDER_SITE_OTHER): Payer: Medicare Other | Admitting: Internal Medicine

## 2018-05-05 VITALS — BP 110/60 | HR 64 | Resp 16 | Ht 69.0 in | Wt 250.0 lb

## 2018-05-05 DIAGNOSIS — J449 Chronic obstructive pulmonary disease, unspecified: Secondary | ICD-10-CM

## 2018-05-05 MED ORDER — ARFORMOTEROL TARTRATE 15 MCG/2ML IN NEBU: 15.0000 ug | INHALATION_SOLUTION | Freq: Two times a day (BID) | RESPIRATORY_TRACT | 5 refills | Status: DC

## 2018-05-05 MED ORDER — BUDESONIDE 0.5 MG/2ML IN SUSP: 0.5000 mg | Freq: Two times a day (BID) | RESPIRATORY_TRACT | 12 refills | Status: DC

## 2018-05-05 MED ORDER — AMBULATORY NON FORMULARY MEDICATION
0 refills | Status: DC
Start: 2018-05-05 — End: 2021-02-19

## 2018-05-05 MED ORDER — AMBULATORY NON FORMULARY MEDICATION: 0 refills | Status: DC

## 2018-05-05 NOTE — Patient Instructions (Addendum)
Stop inhalers.  Start nebulized brovana and pulmicort, both twice per day. Do flutter valve after neb treatments.

## 2018-05-05 NOTE — Addendum Note (Signed)
Addended by: Janean SarkSNIPES, SONYA K on: 05/05/2018 03:28 PM   Modules accepted: Orders

## 2018-05-05 NOTE — Progress Notes (Signed)
[facility]* [facility]     Assessment and Plan:  COPD, Group D with severe baseline symptoms and recent hospitalization for exacerbation.  Chronically elevated bilateral diaphragms, with bibasilar atelectasis. Chronic hypoxic respiratory failure. Severe debility/deconditioning, at high risk for exacerbation, hospitalization.  Chronic pulmonary edema, ILD seems less likely but not ruled out.  Wheezing on exam, which could be due to COPD exacerbation, versus volume overload. Interstitial lung disease, pulmonary fibrosis per history, most recent CT chest from 04/19/2018 reviewed, does not appear that disease has advanced significantly since previous CT about 9 years ago.  -Pt was attempted on inhaler but he could not actuate dulera and could not coordinate spiriva.  --Will change to nebulized brovana and pulmicort, both twice per day.  - Albuterol MDI/nebs as needed.  -Advance activity as tolerated. -No need for further work-up or interventions in regards to the patient's history of interstitial lung disease.    Date: 05/05/2018  MRN# [id number] Kyle CockerGerald D Kary Dec 30, 1930   Kyle Frederick is a 84 y.o. old male seen in follow up for chief complaint of  Chief Complaint  Patient presents with  . Hospitalization Follow-up    pt is getting PT/OT he is getting up to use restroom in which he will experience sob.  Marland Kitchen. COPD    sl. wheezing pt on 2 L 02.  . Cough    after inhaler use/or after using peak flow meter.     HPI:  The patient is 84 year old male with a history of COPD, CVA, cardiomegaly, possible interstitial lung disease.  He has chronic severe respiratory failure, on 4L at home, he was admitted May 2019 with AECOPD. He is using oxygen at 2L. He has never been tested for sleep apnea.   **I personally reviewed imaging 04/25/2018, there is bibasilar infiltrates, opacity suggestive of effusions.  Previous chest x-ray shows chronically elevated right diaphragm.  Previous  CT chest 07/01/2013, shows bibasilar interstitial changes suggestive of pulmonary fibrosis. **Echocardiogram 04/25/2018; EF equals 55%     Medication:    Current Outpatient Medications:  .  acetaminophen (TYLENOL) 650 MG CR tablet, Take 2 tablets by mouth every 8 (eight) hours as needed., Disp: , Rfl:  .  aspirin 81 MG EC tablet, Take 1 tablet by mouth daily. , Disp: , Rfl:  .  Hydrocortisone (GERHARDT'S BUTT CREAM) CREA, Apply 1 application topically 3 (three) times daily for 7 days., Disp: 1 each, Rfl: 0 .  ipratropium-albuterol (DUONEB) 0.5-2.5 (3) MG/3ML SOLN, Take 3 mLs by nebulization every 6 (six) hours as needed (sob wheezing)., Disp: 360 mL, Rfl: 0 .  mometasone-formoterol (DULERA) 100-5 MCG/ACT AERO, Inhale 2 puffs into the lungs 2 (two) times daily., Disp: 0.1 g, Rfl: 0 .  polyethylene glycol (MIRALAX / GLYCOLAX) packet, Take 1 packet by mouth daily as needed., Disp: , Rfl:  .  simvastatin (ZOCOR) 20 MG tablet, Take 1 tablet by mouth daily., Disp: , Rfl: 0   Allergies:  Lasix [furosemide]  Review of Systems: Gen:  Denies  fever, sweats. HEENT: Denies blurred vision. Cvc:  No dizziness, chest pain or heaviness Resp:   Denies cough or sputum porduction. Gi: Denies swallowing difficulty, stomach pain. constipation, bowel incontinence Gu:  Denies bladder incontinence, burning urine Ext:   No Joint pain, stiffness. Skin: No skin rash, easy bruising. Endoc:  No polyuria, polydipsia. Psych: No depression, insomnia. Other:  All other systems were reviewed and found to be negative other than what is mentioned in the HPI.  Physical Examination:   VS: BP 110/60 (BP Location: Right Arm, Cuff Size: Large)   Pulse 64   Resp 16   Ht 5\' 9"  (1.753 m)   Wt 250 lb (113.4 kg)   SpO2 94%   BMI 36.92 kg/m    General Appearance: No distress  Neuro:without focal findings,  speech normal,  HEENT: PERRLA, EOM intact. Pulmonary: normal breath sounds, No wheezing.   CardiovascularNormal  S1,S2.  No m/r/g.   Abdomen: Benign, Soft, non-tender. Renal:  No costovertebral tenderness  GU:  Not performed at this time. Endoc: No evident thyromegaly, no signs of acromegaly. Skin:   warm, no rash. Extremities: normal, no cyanosis, clubbing.   LABORATORY PANEL:   CBC No results for input(s): WBC, HGB, HCT, PLT in the last 168 hours. ------------------------------------------------------------------------------------------------------------------  Chemistries  No results for input(s): NA, K, CL, CO2, GLUCOSE, BUN, CREATININE, CALCIUM, MG, AST, ALT, ALKPHOS, BILITOT in the last 168 hours.  Invalid input(s): GFRCGP ------------------------------------------------------------------------------------------------------------------  Cardiac Enzymes No results for input(s): TROPONINI in the last 168 hours. ------------------------------------------------------------  RADIOLOGY:   No results found for this or any previous visit. No results found for this or any previous visit. ------------------------------------------------------------------------------------------------------------------  Thank  you for allowing [facility] to assist in the care of your patient. Our recommendations are noted above.  Please contact us if we can be of further service.   Wells Guiles, MD.  [facility] Office Number: [phone]  Santiago Glad, M.D.  Billy Fischer, M.D  05/05/2018

## 2018-05-06 ENCOUNTER — Other Ambulatory Visit: Payer: Self-pay | Admitting: *Deleted

## 2018-05-06 MED ORDER — ARFORMOTEROL TARTRATE 15 MCG/2ML IN NEBU: 15.0000 ug | INHALATION_SOLUTION | Freq: Two times a day (BID) | RESPIRATORY_TRACT | 5 refills | Status: DC

## 2018-05-06 MED ORDER — BUDESONIDE 0.5 MG/2ML IN SUSP: 0.5000 mg | Freq: Two times a day (BID) | RESPIRATORY_TRACT | 5 refills | Status: DC

## 2018-10-03 NOTE — Progress Notes (Signed)
[facility]     Assessment and Plan:   COPD, Group D with severe baseline symptoms and hospitalization for exacerbation.  At high risk for recurrent exacerbation. --Continue nebulized brovana, pulmicort.  -Pt was attempted on inhaler but he could not actuate dulera and could not coordinate spiriva.   Chronically elevated bilateral diaphragms, with bibasilar atelectasis. --Likely contributing to dyspnea.   Chronic hypoxic respiratory failure. Severe debility/deconditioning, at high risk for exacerbation, hospitalization.  --Continue oxygen 2 to 3 L.  Chronic pulmonary edema.   --Likely contributing to dyspnea.  Interstitial lung disease, pulmonary fibrosis per history, most recent CT chest from 04/19/2018 reviewed, does not appear that disease has advanced significantly since previous CT about 9 years ago. - Given patient's advanced age and multiple comorbidities, would not consider further work-up of ILD at this time. ---No need for further work-up or interventions in regards to the patient's history of interstitial lung disease.  DNR. -Patient confirms today his CODE STATUS is DNR, he does not want aggressive interventions/life support.  Return in about 6 months (around 04/04/2019).    Date: 10/03/2018  MRN# [id number] Kyle Frederick 23-Jul-1931   Kyle Frederick is a 84 y.o. old male seen in follow up for chief complaint of  Chief Complaint  Patient presents with  . COPD    pt states breathing stable. He has been on 3 L POC and they are wanting to know if he can go back down to 2 L.     HPI:  The patient is 84 year old male with a history of COPD, CVA, cardiomegaly, possible interstitial lung disease.  He has chronic severe respiratory failure, on 4L at home, he was admitted May 2019 with AECOPD. Since his last visit here he has had no further flare ups of COPD. He is using brovana and pulmicort both twice daily and he feels that they are helping.    He remains on oxygen via POC pulse dose on 2L. He had a flu shot this season.  He has a living will which he believes is DNR and has a sign on the refrigerator.   He is using oxygen at 2L. He has never been tested for sleep apnea.   **CT chest 04/25/2018, there is bibasilar infiltrates, opacity suggestive of effusions.  Previous chest x-ray shows chronically elevated right diaphragm.  Previous CT chest 07/01/2013, shows bibasilar interstitial changes suggestive of pulmonary fibrosis. **Echocardiogram 04/25/2018; EF equals 55%   Medication:    Current Outpatient Medications:  .  acetaminophen (TYLENOL) 650 MG CR tablet, Take 2 tablets by mouth every 8 (eight) hours as needed., Disp: , Rfl:  .  AMBULATORY NON FORMULARY MEDICATION, Medication Name: Please dispense nebulizer and supplies. Medications sent to pharmacy. DX J44.9 DME: [facility], Disp: 1 each, Rfl: 0 .  AMBULATORY NON FORMULARY MEDICATION, Medication Name: Please dispense flutter valve. Use after each nebulizer treatment. DME: [facility] DX: J44.9, Disp: 1 each, Rfl: 0 .  arformoterol (BROVANA) 15 MCG/2ML NEBU, Take 2 mLs (15 mcg total) by nebulization 2 (two) times daily. DX: J44.9 COPD, Disp: 120 mL, Rfl: 5 .  aspirin 81 MG EC tablet, Take 1 tablet by mouth daily. , Disp: , Rfl:  .  budesonide (PULMICORT) 0.5 MG/2ML nebulizer solution, Take 2 mLs (0.5 mg total) by nebulization 2 (two) times daily. DX: J44.9 COPD, Disp: 120 mL, Rfl: 5 .  ipratropium-albuterol (DUONEB) 0.5-2.5 (3) MG/3ML SOLN, Take 3 mLs by nebulization every 6 (six) hours as needed (sob wheezing).,  Disp: 360 mL, Rfl: 0 .  mometasone-formoterol (DULERA) 100-5 MCG/ACT AERO, Inhale 2 puffs into the lungs 2 (two) times daily., Disp: 0.1 g, Rfl: 0 .  polyethylene glycol (MIRALAX / GLYCOLAX) packet, Take 1 packet by mouth daily as needed., Disp: , Rfl:  .  simvastatin (ZOCOR) 20 MG tablet, Take 1 tablet by mouth daily., Disp: , Rfl: 0   Allergies:  Lasix [furosemide]  Review of  Systems:  Constitutional: Feels well. Cardiovascular: Denies chest pain, exertional chest pain.  Pulmonary: Denies hemoptysis, pleuritic chest pain.   The remainder of systems were reviewed and were found to be negative other than what is documented in the HPI.    Physical Examination:   VS: BP 124/60 (BP Location: Left Arm, Cuff Size: Large)   Pulse 68   Resp 16   Ht 5\' 9"  (1.753 m)   Wt 253 lb (114.8 kg) Comment: home  SpO2 100%   BMI 37.36 kg/m   General Appearance: No distress  Neuro:without focal findings, mental status, speech normal, alert and oriented HEENT: PERRLA, EOM intact Pulmonary: Diffuse bilateral expiratory crackles.  Greatest in the bases. CardiovascularNormal S1,S2.  No m/r/g.  Abdomen: Benign, Soft, non-tender, No masses Renal:  No costovertebral tenderness  GU:  No performed at this time. Endoc: No evident thyromegaly, no signs of acromegaly or Cushing features Skin:   warm, no rashes, no ecchymosis  Extremities: normal, no cyanosis, clubbing.     LABORATORY PANEL:   CBC No results for input(s): WBC, HGB, HCT, PLT in the last 168 hours. ------------------------------------------------------------------------------------------------------------------  Chemistries  No results for input(s): NA, K, CL, CO2, GLUCOSE, BUN, CREATININE, CALCIUM, MG, AST, ALT, ALKPHOS, BILITOT in the last 168 hours.  Invalid input(s): GFRCGP ------------------------------------------------------------------------------------------------------------------  Cardiac Enzymes No results for input(s): TROPONINI in the last 168 hours. ------------------------------------------------------------  RADIOLOGY:   No results found for this or any previous visit. No results found for this or any previous visit. ------------------------------------------------------------------------------------------------------------------  Thank  you for allowing [facility] to assist in the care of your patient. Our recommendations are noted above.  Please contact us if we can be of further service.  Wells Guiles, M.D., F.C.C.P.  Board Certified in Internal Medicine, Pulmonary Medicine, Critical Care Medicine, and Sleep Medicine.  [facility] Office Number: [phone]   10/03/2018

## 2018-10-04 ENCOUNTER — Ambulatory Visit: Payer: Medicare Other | Admitting: Internal Medicine

## 2018-10-04 ENCOUNTER — Encounter: Payer: Self-pay | Admitting: Internal Medicine

## 2018-10-04 VITALS — BP 124/60 | HR 68 | Resp 16 | Ht 69.0 in | Wt 253.0 lb

## 2018-10-04 DIAGNOSIS — J449 Chronic obstructive pulmonary disease, unspecified: Secondary | ICD-10-CM

## 2018-10-04 NOTE — Patient Instructions (Signed)
Continue brovana and pulmicort nebulizers twice daily.

## 2018-11-18 ENCOUNTER — Other Ambulatory Visit: Payer: Self-pay | Admitting: Internal Medicine

## 2018-11-19 ENCOUNTER — Other Ambulatory Visit: Payer: Self-pay | Admitting: Internal Medicine

## 2018-11-19 NOTE — Telephone Encounter (Signed)
Called patient and notified that I spoke with [facility] on 12/19 and refill was being processed. Informed patient to call [facility] to check the status. Nothing further needed.

## 2018-11-22 ENCOUNTER — Other Ambulatory Visit: Payer: Self-pay | Admitting: Internal Medicine

## 2018-11-22 NOTE — Telephone Encounter (Signed)
Spoke to daughter, let her know [facility] bro[facility]ana. They will contact the patient when it arrives. Nothing further needed at this time.

## 2018-12-21 ENCOUNTER — Other Ambulatory Visit: Payer: Self-pay | Admitting: Internal Medicine

## 2018-12-28 ENCOUNTER — Other Ambulatory Visit: Payer: Self-pay | Admitting: Internal Medicine

## 2018-12-28 ENCOUNTER — Other Ambulatory Visit: Payer: Self-pay

## 2018-12-28 MED ORDER — ARFORMOTEROL TARTRATE 15 MCG/2ML IN NEBU: INHALATION_SOLUTION | RESPIRATORY_TRACT | 0 refills | Status: DC

## 2018-12-28 NOTE — Telephone Encounter (Signed)
I resent rx with diagnosis code, confirmed with [name] at [facility] that they did receive it. Then called and spoke with patients daughter and let her know Kyle Frederick was working on it. She was very appreciative with no further questions at this time.

## 2019-02-17 ENCOUNTER — Telehealth: Payer: Self-pay | Admitting: Internal Medicine

## 2019-02-17 MED ORDER — ARFORMOTEROL TARTRATE 15 MCG/2ML IN NEBU: INHALATION_SOLUTION | RESPIRATORY_TRACT | 2 refills | Status: DC

## 2019-02-17 NOTE — Telephone Encounter (Signed)
Called and spoke to pt's spouse, Eunice Blase (DPR). Eunice Blase is requesting Rx refill for Brovana. Rx for Rosalyn Gess has been sent to preferred pharmacy with two refills. Nothing further is needed.

## 2019-02-21 ENCOUNTER — Other Ambulatory Visit: Payer: Self-pay | Admitting: Internal Medicine

## 2019-02-21 MED ORDER — ARFORMOTEROL TARTRATE 15 MCG/2ML IN NEBU: INHALATION_SOLUTION | RESPIRATORY_TRACT | 2 refills | Status: AC

## 2019-02-21 NOTE — Telephone Encounter (Signed)
Step daughter called back and stated that Pharmacy is saying that they need diagnosis code for below rx.  CB#[phone]

## 2019-02-21 NOTE — Telephone Encounter (Signed)
Rx for Rosalyn Gess was sent to [facility] with dx code on 02/17/19 and again today 02/21/19. As copied below.  I have spoken to [facility] pharmacy, who verified that they did receive Rx with dx code and it is ready for pickup.  Pt's daughter in law, Eunice Blase [facility]) is aware and voiced her understanding.  Nothing further is needed.       arformoterol (BROVANA) 15 MCG/2ML NEBU (Discontinued) 120 mL 2 02/17/2019 02/21/2019   Sig: USE 1 VIAL IN NEBULIZER TWO TIMES DAILY.   Sent to pharmacy as: arformoterol (BROVANA) 15 MCG/2ML Nebu Soln   Notes to Pharmacy: Dx: J44.9   Reason for Discontinue: Reorder   E-Prescribing Status: Receipt confirmed by pharmacy (02/17/2019 1:35 PM EDT)

## 2019-04-05 ENCOUNTER — Other Ambulatory Visit: Payer: Self-pay | Admitting: Internal Medicine

## 2019-10-03 ENCOUNTER — Other Ambulatory Visit: Payer: Self-pay

## 2019-10-03 ENCOUNTER — Ambulatory Visit: Payer: Medicare Other | Admitting: Internal Medicine

## 2019-11-08 ENCOUNTER — Encounter: Payer: Self-pay | Admitting: Internal Medicine

## 2019-11-08 ENCOUNTER — Ambulatory Visit (INDEPENDENT_AMBULATORY_CARE_PROVIDER_SITE_OTHER): Payer: Medicare Other | Admitting: Internal Medicine

## 2019-11-08 ENCOUNTER — Other Ambulatory Visit: Payer: Self-pay

## 2019-11-08 DIAGNOSIS — J449 Chronic obstructive pulmonary disease, unspecified: Secondary | ICD-10-CM

## 2019-11-08 DIAGNOSIS — J9611 Chronic respiratory failure with hypoxia: Secondary | ICD-10-CM

## 2019-11-08 NOTE — Patient Instructions (Signed)
Continue NEB therapy Continue Oxygen

## 2019-11-08 NOTE — Progress Notes (Signed)
[facility]    I connected with the patient by telephone enabled telemedicine visit and verified that I am speaking with the correct person using two identifiers.    I discussed the limitations, risks, security and privacy concerns of performing an evaluation and management service by telemedicine and the availability of in-person appointments. I also discussed with the patient that there may be a patient responsible charge related to this service. The patient expressed understanding and agreed to proceed.  PATIENT AGREES AND CONFIRMS -YES   Other persons participating in the visit and their role in the encounter: Patient, nursing  This visit type was conducted due to national recommendations for restrictions regarding the COVID-19 Pandemic (e.g. social distancing).  This format is felt to be most appropriate for this patient at this time.  All issues noted in this document were discussed and addressed.        Date: 11/08/2019  MRN# [id number] Kyle Frederick Feb 26, 1931   Kyle Frederick is a 85 y.o. old male seen in follow up for chief complaint of  No chief complaint on file.    SYNOPSIS The patient is 85 year old male with a history of COPD, CVA, cardiomegaly, possible interstitial lung disease.  He has chronic severe respiratory failure, on 4L at home, he was admitted May 2019 with AECOPD. Since his last visit here he has had no further flare ups of COPD. He is using brovana and pulmicort both twice daily and he feels that they are helping.  He remains on oxygen via POC pulse dose on 2L.  He has a living will which he believes is DNR and has a sign on the refrigerator.   He is using oxygen at 2L. He has never been tested for sleep apnea.   **CT chest 04/25/2018, there is bibasilar infiltrates, opacity suggestive of effusions.  Previous chest x-ray shows chronically elevated right diaphragm.  Previous CT chest 07/01/2013, shows bibasilar interstitial changes  suggestive of pulmonary fibrosis. **Echocardiogram 04/25/2018; EF equals 55%  CC Follow up COPD Follow up chronic resp failure  HPI No  COPD exacerbation at this time + chronic  heart failure at this time LE edema No evidence or signs of infection at this time No respiratory distress No fevers, chills, nausea, vomiting, diarrhea +lower extremity edema No evidence hemoptysis  Most of History is taken from [facility] Patient DNR/DNI   Medication:    Current Outpatient Medications:  .  acetaminophen (TYLENOL) 650 MG CR tablet, Take 2 tablets by mouth every 8 (eight) hours as needed., Disp: , Rfl:  .  AMBULATORY NON FORMULARY MEDICATION, Medication Name: Please dispense nebulizer and supplies. Medications sent to pharmacy. DX J44.9 DME: [facility], Disp: 1 each, Rfl: 0 .  AMBULATORY NON FORMULARY MEDICATION, Medication Name: Please dispense flutter valve. Use after each nebulizer treatment. DME: [facility] DX: J44.9, Disp: 1 each, Rfl: 0 .  arformoterol (BROVANA) 15 MCG/2ML NEBU, USE 1 VIAL IN NEBULIZER TWO TIMES DAILY., Disp: 120 mL, Rfl: 2 .  aspirin 81 MG EC tablet, Take 1 tablet by mouth daily. , Disp: , Rfl:  .  budesonide (PULMICORT) 0.5 MG/2ML nebulizer solution, USE 1 VIAL IN NEBULIZER TWO TIMES DAILY., Disp: 120 mL, Rfl: 2 .  ipratropium-albuterol (DUONEB) 0.5-2.5 (3) MG/3ML SOLN, Take 3 mLs by nebulization every 6 (six) hours as needed (sob wheezing)., Disp: 360 mL, Rfl: 0 .  polyethylene glycol (MIRALAX / GLYCOLAX) packet, Take 1 packet by mouth daily as needed., Disp: , Rfl:  .  simvastatin (ZOCOR) 20 MG tablet, Take 1 tablet by mouth daily., Disp: , Rfl: 0   Allergies:  Lasix [furosemide]  Review of Systems:  Gen:  Denies  fever, sweats, chills weight loss  HEENT: Denies blurred vision, double vision, ear pain, eye pain, hearing loss, nose bleeds, sore throat Cardiac:  No dizziness, chest pain or heaviness, chest tightness,edema, No JVD Resp:   No cough, -sputum production,  +shortness of breath,-wheezing, -hemoptysis,  Gi: Denies swallowing difficulty, stomach pain, nausea or vomiting, diarrhea, constipation, bowel incontinence Gu:  Denies bladder incontinence, burning urine Ext:   Denies Joint pain, stiffness or swelling Skin: Denies  skin rash, easy bruising or bleeding or hives Endoc:  Denies polyuria, polydipsia , polyphagia or weight change Psych:   Denies depression, insomnia or hallucinations  Other:  All other systems negative    Physical Examination:   VS: There were no vitals taken for this visit.    Assessment and Plan:   COPD, Group D with severe baseline symptoms and hospitalization for exacerbation.  At high risk for recurrent exacerbation. Patient with very poor resp insufficiency h/o CVA in 2012 Now on NEB THERAPY Brovana and Pulmicort Patient not able to tolerate traditional inhaler therapy    Chronically elevated bilateral diaphragms, with bibasilar atelectasis. This is definitely contributing to his resp demise  Chronic hypoxic respiratory failure. Severe debility/deconditioning, at high risk for exacerbation, hospitalization.  Patient requires this for survival He uses oxygen continiously  Chronic PUL EDEMA Another cause for his resp demise  Interstitial lung disease, pulmonary fibrosis per history, most recent CT chest from 04/19/2018 reviewed, does not appear that disease has advanced significantly since previous CT about 9 years ago. No further work up at this time due to other comorbid conditions and advacned age  DNR. Patient is DNR/DNI    COVID-19 EDUCATION: The signs and symptoms of COVID-19 were discussed with the patient and how to seek care for testing.  The importance of social distancing was discussed today. Hand Washing Techniques and avoid touching face was advised.     MEDICATION ADJUSTMENTS/LABS AND TESTS ORDERED: Recommend Continuing NEB THERAPY continue oxygen    CURRENT MEDICATIONS REVIEWED AT  LENGTH WITH PATIENT TODAY   Patient satisfied with Plan of action and management. All questions answered  Follow up in 6 months Total Time Spent 24 mins  Maretta Bees Patricia Pesa, M.D.  [facility]  Medical Director Marie Director [facility] Cardio-Pulmonary Department

## 2019-12-20 ENCOUNTER — Emergency Department
Admission: EM | Admit: 2019-12-20 | Discharge: 2019-12-20 | Disposition: A | Discharge status: Home / Self Care | Payer: Medicare Other | Attending: Emergency Medicine | Admitting: Emergency Medicine

## 2019-12-20 ENCOUNTER — Other Ambulatory Visit: Payer: Self-pay

## 2019-12-20 ENCOUNTER — Emergency Department: Payer: Medicare Other

## 2019-12-20 DIAGNOSIS — W19XXXA Unspecified fall, initial encounter: Secondary | ICD-10-CM | POA: Diagnosis not present

## 2019-12-20 DIAGNOSIS — Y998 Other external cause status: Secondary | ICD-10-CM | POA: Diagnosis not present

## 2019-12-20 DIAGNOSIS — Z87891 Personal history of nicotine dependence: Secondary | ICD-10-CM | POA: Insufficient documentation

## 2019-12-20 DIAGNOSIS — Z79899 Other long term (current) drug therapy: Secondary | ICD-10-CM | POA: Insufficient documentation

## 2019-12-20 DIAGNOSIS — Z8673 Personal history of transient ischemic attack (TIA), and cerebral infarction without residual deficits: Secondary | ICD-10-CM | POA: Insufficient documentation

## 2019-12-20 DIAGNOSIS — J449 Chronic obstructive pulmonary disease, unspecified: Secondary | ICD-10-CM | POA: Diagnosis not present

## 2019-12-20 DIAGNOSIS — N3091 Cystitis, unspecified with hematuria: Secondary | ICD-10-CM | POA: Diagnosis not present

## 2019-12-20 DIAGNOSIS — S0083XA Contusion of other part of head, initial encounter: Secondary | ICD-10-CM | POA: Insufficient documentation

## 2019-12-20 DIAGNOSIS — Y92018 Other place in single-family (private) house as the place of occurrence of the external cause: Secondary | ICD-10-CM | POA: Diagnosis not present

## 2019-12-20 DIAGNOSIS — Z7982 Long term (current) use of aspirin: Secondary | ICD-10-CM | POA: Insufficient documentation

## 2019-12-20 DIAGNOSIS — R21 Rash and other nonspecific skin eruption: Secondary | ICD-10-CM | POA: Diagnosis not present

## 2019-12-20 DIAGNOSIS — B372 Candidiasis of skin and nail: Secondary | ICD-10-CM | POA: Insufficient documentation

## 2019-12-20 DIAGNOSIS — Z20822 Contact with and (suspected) exposure to covid-19: Secondary | ICD-10-CM | POA: Insufficient documentation

## 2019-12-20 DIAGNOSIS — I110 Hypertensive heart disease with heart failure: Secondary | ICD-10-CM | POA: Diagnosis not present

## 2019-12-20 DIAGNOSIS — I5030 Unspecified diastolic (congestive) heart failure: Secondary | ICD-10-CM | POA: Diagnosis not present

## 2019-12-20 DIAGNOSIS — S0990XA Unspecified injury of head, initial encounter: Secondary | ICD-10-CM | POA: Diagnosis present

## 2019-12-20 DIAGNOSIS — Y9389 Activity, other specified: Secondary | ICD-10-CM | POA: Insufficient documentation

## 2019-12-20 HISTORY — DX: Cerebral infarction, unspecified: I63.9

## 2019-12-20 LAB — CBC WITH DIFFERENTIAL/PLATELET
Abs Immature Granulocytes: 0.12 10*3/uL — ABNORMAL HIGH (ref 0.00–0.07)
Basophils Absolute: 0 10*3/uL (ref 0.0–0.1)
Basophils Relative: 0 %
Eosinophils Absolute: 0 10*3/uL (ref 0.0–0.5)
Eosinophils Relative: 0 %
HCT: 36.3 % — ABNORMAL LOW (ref 39.0–52.0)
Hemoglobin: 10.5 g/dL — ABNORMAL LOW (ref 13.0–17.0)
Immature Granulocytes: 1 %
Lymphocytes Relative: 4 %
Lymphs Abs: 0.6 10*3/uL — ABNORMAL LOW (ref 0.7–4.0)
MCH: 25.3 pg — ABNORMAL LOW (ref 26.0–34.0)
MCHC: 28.9 g/dL — ABNORMAL LOW (ref 30.0–36.0)
MCV: 87.5 fL (ref 80.0–100.0)
Monocytes Absolute: 0.8 10*3/uL (ref 0.1–1.0)
Monocytes Relative: 6 %
Neutro Abs: 13.1 10*3/uL — ABNORMAL HIGH (ref 1.7–7.7)
Neutrophils Relative %: 89 %
Platelets: 189 10*3/uL (ref 150–400)
RBC: 4.15 MIL/uL — ABNORMAL LOW (ref 4.22–5.81)
RDW: 13.6 % (ref 11.5–15.5)
WBC: 14.7 10*3/uL — ABNORMAL HIGH (ref 4.0–10.5)
nRBC: 0 % (ref 0.0–0.2)

## 2019-12-20 LAB — COMPREHENSIVE METABOLIC PANEL
ALT: 16 U/L (ref 0–44)
AST: 32 U/L (ref 15–41)
Albumin: 4.1 g/dL (ref 3.5–5.0)
Alkaline Phosphatase: 52 U/L (ref 38–126)
Anion gap: 10 (ref 5–15)
BUN: 27 mg/dL — ABNORMAL HIGH (ref 8–23)
CO2: 35 mmol/L — ABNORMAL HIGH (ref 22–32)
Calcium: 9.4 mg/dL (ref 8.9–10.3)
Chloride: 97 mmol/L — ABNORMAL LOW (ref 98–111)
Creatinine, Ser: 1.07 mg/dL (ref 0.61–1.24)
GFR calc Af Amer: >60 mL/min (ref >60)
GFR calc non Af Amer: >60 mL/min (ref >60)
Glucose, Bld: 110 mg/dL — ABNORMAL HIGH (ref 70–99)
Potassium: 4.7 mmol/L (ref 3.5–5.1)
Sodium: 142 mmol/L (ref 135–145)
Total Bilirubin: 0.8 mg/dL (ref 0.3–1.2)
Total Protein: 7.1 g/dL (ref 6.5–8.1)

## 2019-12-20 LAB — URINALYSIS, COMPLETE (UACMP) WITH MICROSCOPIC
Bilirubin Urine: NEGATIVE
Glucose, UA: NEGATIVE mg/dL
Ketones, ur: NEGATIVE mg/dL
Nitrite: NEGATIVE
Protein, ur: 100 mg/dL — AB
RBC / HPF: >50 RBC/hpf — ABNORMAL HIGH (ref 0–5)
Specific Gravity, Urine: 1.018 (ref 1.005–1.030)
pH: 7 (ref 5.0–8.0)

## 2019-12-20 LAB — POC SARS CORONAVIRUS 2 AG: SARS Coronavirus 2 Ag: NEGATIVE

## 2019-12-20 MED ORDER — NYSTATIN 100000 UNIT/GM EX POWD: 1.0000 "application " | Freq: Three times a day (TID) | CUTANEOUS | 0 refills | Status: AC

## 2019-12-20 MED ORDER — CEPHALEXIN 500 MG PO CAPS: 500.0000 mg | ORAL_CAPSULE | Freq: Three times a day (TID) | ORAL | 0 refills | Status: DC

## 2019-12-20 NOTE — ED Triage Notes (Signed)
Pt arrives [facility] from home for a fall sometime over night. Pt has bruising to L forehead area. Skin tears noted to bilat wrists. Hx stroke. On 2 L [place] chronically.

## 2019-12-20 NOTE — Discharge Instructions (Addendum)
CT scans of the head and neck were negative for any acute injury.  Your other lab test today were all reassuring, but your urine test does suggest he might be developing a urinary tract infection.  Also your groin rash looks like a fungal skin infection.  Take Keflex for the urinary tract infection and nystatin for the skin infection.  Follow-up with your primary care doctor for continued monitoring of your symptoms.

## 2019-12-20 NOTE — ED Provider Notes (Signed)
[facility] Emergency Department Provider Note  ____________________________________________  Time seen: Approximately 1:37 PM  I have reviewed the triage vital signs and the nursing notes.   HISTORY  Chief Complaint Fall    HPI Kyle Frederick is a 86 y.o. male with a history of COPD, CHF, stroke who comes the ED due to a fall overnight.  He has some bruising to his left forehead, skin tears to his wrist.  Denies any acute chest pain or shortness of breath.  No headache or neck pain, no paresthesias or motor weakness.  States he was in his usual state of health before the fall.  No cough fevers chills body aches.  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  He does complain of a uncomfortable rash in the groin.      Past Medical History:  Diagnosis Date  . CHF (congestive heart failure) ([place])   . COPD (chronic obstructive pulmonary disease) ([place])   . Stroke [facility])      Patient Active Problem List   Diagnosis Date Noted  . Acute diastolic CHF (congestive heart failure) ([place]) 04/28/2018  . Acute exacerbation of chronic obstructive pulmonary disease (COPD) ([place]) 04/25/2018  . CVA, old, aphasia 04/25/2018  . Interstitial lung disease ([place]) 04/25/2018  . Pressure injury of skin 04/25/2018  . Anemia due to chronic illness 11/12/2017  . Hypertension 06/21/2014     No past surgical history on file.   Prior to Admission medications   Medication Sig Start Date End Date Taking? Authorizing Provider  acetaminophen (TYLENOL) 650 MG CR tablet Take 2 tablets by mouth every 8 (eight) hours as needed.    [provider]  AMBULATORY NON FORMULARY MEDICATION Medication Name: Please dispense nebulizer and supplies. Medications sent to pharmacy. DX J44.9 DME: [facility] 05/05/18   Laverle Hobby, MD  AMBULATORY NON FORMULARY MEDICATION Medication Name: Please dispense flutter valve. Use after each nebulizer  treatment. DME: [facility] DX: J44.9 05/05/18   Laverle Hobby, MD  arformoterol (BROVANA) 15 MCG/2ML NEBU USE 1 VIAL IN NEBULIZER TWO TIMES DAILY. 02/21/19   Laverle Hobby, MD  aspirin 81 MG EC tablet Take 1 tablet by mouth daily.     [provider]  budesonide (PULMICORT) 0.5 MG/2ML nebulizer solution USE 1 VIAL IN NEBULIZER TWO TIMES DAILY. 04/06/19   Laverle Hobby, MD  cephALEXin (KEFLEX) 500 MG capsule Take 1 capsule (500 mg total) by mouth 3 (three) times daily. 12/20/19   Carrie Mew, MD  ipratropium-albuterol (DUONEB) 0.5-2.5 (3) MG/3ML SOLN Take 3 mLs by nebulization every 6 (six) hours as needed (sob wheezing). 04/28/18   Bettey Costa, MD  nystatin (MYCOSTATIN/NYSTOP) powder Apply 1 application topically 3 (three) times daily for 14 days. Apply to groin rash 12/20/19 01/03/20  Carrie Mew, MD  polyethylene glycol [facility] / Floria Raveling) packet Take 1 packet by mouth daily as needed.    [provider]  simvastatin (ZOCOR) 20 MG tablet Take 1 tablet by mouth daily. 02/09/18   [provider]     Allergies Lasix [furosemide]   No family history on file.  Social History Social History   Tobacco Use  . Smoking status: Former Research scientist (life sciences)  . Smokeless tobacco: Never Used  Substance Use Topics  . Alcohol use: Never  . Drug use: Never    Review of Systems  Constitutional:   No fever or chills.  ENT:   No sore throat. No rhinorrhea. Cardiovascular:   No chest pain  or syncope. Respiratory:   No dyspnea or cough. Gastrointestinal:   Negative for abdominal pain, vomiting and diarrhea.  Musculoskeletal:   Negative for focal pain or swelling All other systems reviewed and are negative except as documented above in ROS and HPI.  ____________________________________________   PHYSICAL EXAM:  VITAL SIGNS: ED Triage Vitals  Enc Vitals Group     BP 12/20/19 0949 101/68     Pulse Rate 12/20/19 0949 94     Resp 12/20/19 0949 18     Temp  12/20/19 0949 97.7 F (36.5 C)     Temp src --      SpO2 12/20/19 0949 90 %     Weight 12/20/19 0951 250 lb 8.1 oz (113.6 kg)     Height 12/20/19 0951 5\' 10"  (1.778 m)     Head Circumference --      Peak Flow --      Pain Score 12/20/19 0951 8     Pain Loc --      Pain Edu? --      Excl. in GC? --     Vital signs reviewed, nursing assessments reviewed.   Constitutional:   Alert and oriented. Non-toxic appearance. Eyes:   Conjunctivae are normal. EOMI. PERRL. ENT      Head:   Normocephalic and atraumatic.      Nose:   Wearing a mask.      Mouth/Throat:   Wearing a mask.      Neck:   No meningismus. Full ROM. Hematological/Lymphatic/Immunilogical:   No cervical lymphadenopathy. Cardiovascular:   RRR. Symmetric bilateral radial and DP pulses.  No murmurs. Cap refill less than 2 seconds. Respiratory:   Normal respiratory effort without tachypnea/retractions.  Coarse breath sounds bilaterally without focal consolidation or wheezing Gastrointestinal:   Soft and nontender. Non distended. There is no CVA tenderness.  No rebound, rigidity, or guarding. Genitourinary:   Overall normal with irregular erythematous rash around the perineum and inner thighs consistent with candidiasis.  No balanitis, crepitus, induration, or scrotal tenderness. Musculoskeletal:   Normal range of motion in all extremities. No joint effusions.  No lower extremity tenderness.  No edema. Neurologic:   Normal speech and language.  Motor grossly intact. No acute focal neurologic deficits are appreciated.  Skin:    Skin is warm, dry and intact.  Candidiasis rash over the groin as noted above.  No rash noted.  No petechiae, purpura, or bullae.  ____________________________________________    LABS (pertinent positives/negatives) (all labs ordered are listed, but only abnormal results are displayed) Labs Reviewed  URINALYSIS, COMPLETE (UACMP) WITH MICROSCOPIC - Abnormal; Notable for the following components:       Result Value   Color, Urine YELLOW (*)    APPearance HAZY (*)    Hgb urine dipstick LARGE (*)    Protein, ur 100 (*)    Leukocytes,Ua TRACE (*)    RBC / HPF >50 (*)    Bacteria, UA RARE (*)    All other components within normal limits  COMPREHENSIVE METABOLIC PANEL - Abnormal; Notable for the following components:   Chloride 97 (*)    CO2 35 (*)    Glucose, Bld 110 (*)    BUN 27 (*)    All other components within normal limits  CBC WITH DIFFERENTIAL/PLATELET - Abnormal; Notable for the following components:   WBC 14.7 (*)    RBC 4.15 (*)    Hemoglobin 10.5 (*)    HCT 36.3 (*)    MCH 25.3 (*)  MCHC 28.9 (*)    Neutro Abs 13.1 (*)    Lymphs Abs 0.6 (*)    Abs Immature Granulocytes 0.12 (*)    All other components within normal limits  URINE CULTURE  SARS CORONAVIRUS 2 (TAT 6-24 HRS)  POC SARS CORONAVIRUS 2 AG -  ED  POC SARS CORONAVIRUS 2 AG   ____________________________________________   EKG    ____________________________________________    RADIOLOGY  CT HEAD WO CONTRAST  Result Date: 12/20/2019 CLINICAL DATA:  Larey Seat at home. Prior history of a stroke. EXAM: CT HEAD WITHOUT CONTRAST CT CERVICAL SPINE WITHOUT CONTRAST TECHNIQUE: Multidetector CT imaging of the head and cervical spine was performed following the standard protocol without intravenous contrast. Multiplanar CT image reconstructions of the cervical spine were also generated. COMPARISON:  Head CT from 2012. FINDINGS: CT HEAD FINDINGS Brain: Age related cerebral atrophy, ventriculomegaly and periventricular white matter disease. Evidence of prior left-sided infarcts with encephalomalacia and ex vacuo dilatation of the left lateral ventricle. No definite acute infarct or intracranial hemorrhage. Vascular: No hyperdense vessels are identified. Scattered vascular calcifications. Skull: No skull fracture or bone lesions. Sinuses/Orbits: The paranasal sinuses and mastoid air cells are clear. The globes are intact.  Other: No scalp lesions or hematoma. CT CERVICAL SPINE FINDINGS Alignment: Advanced degenerative cervical spondylosis with multilevel degenerative type subluxations. Skull base and vertebrae: No acute fracture. No primary bone lesion or focal pathologic process. Soft tissues and spinal canal: No prevertebral fluid or swelling. No visible canal hematoma. Disc levels: Degenerative cervical spondylosis with multilevel disc disease and facet disease. No significant spinal stenosis. Mild multilevel foraminal stenosis due to uncinate spurring and facet disease. Upper chest: The lung apices are grossly clear. Tortuous ectatic calcified vasculature noted. Other: None IMPRESSION: 1. Age related cerebral atrophy, ventriculomegaly and periventricular white matter disease. 2. Remote left-sided infarcts. 3. No acute intracranial findings or skull fracture. 4. Advanced degenerative cervical spondylosis with multilevel disc disease and facet disease but no acute cervical spine fracture. Electronically Signed   By: Rudie Meyer M.D.   On: 12/20/2019 11:38   CT CERVICAL SPINE WO CONTRAST  Result Date: 12/20/2019 CLINICAL DATA:  Larey Seat at home. Prior history of a stroke. EXAM: CT HEAD WITHOUT CONTRAST CT CERVICAL SPINE WITHOUT CONTRAST TECHNIQUE: Multidetector CT imaging of the head and cervical spine was performed following the standard protocol without intravenous contrast. Multiplanar CT image reconstructions of the cervical spine were also generated. COMPARISON:  Head CT from 2012. FINDINGS: CT HEAD FINDINGS Brain: Age related cerebral atrophy, ventriculomegaly and periventricular white matter disease. Evidence of prior left-sided infarcts with encephalomalacia and ex vacuo dilatation of the left lateral ventricle. No definite acute infarct or intracranial hemorrhage. Vascular: No hyperdense vessels are identified. Scattered vascular calcifications. Skull: No skull fracture or bone lesions. Sinuses/Orbits: The paranasal  sinuses and mastoid air cells are clear. The globes are intact. Other: No scalp lesions or hematoma. CT CERVICAL SPINE FINDINGS Alignment: Advanced degenerative cervical spondylosis with multilevel degenerative type subluxations. Skull base and vertebrae: No acute fracture. No primary bone lesion or focal pathologic process. Soft tissues and spinal canal: No prevertebral fluid or swelling. No visible canal hematoma. Disc levels: Degenerative cervical spondylosis with multilevel disc disease and facet disease. No significant spinal stenosis. Mild multilevel foraminal stenosis due to uncinate spurring and facet disease. Upper chest: The lung apices are grossly clear. Tortuous ectatic calcified vasculature noted. Other: None IMPRESSION: 1. Age related cerebral atrophy, ventriculomegaly and periventricular white matter disease. 2. Remote left-sided infarcts. 3.  No acute intracranial findings or skull fracture. 4. Advanced degenerative cervical spondylosis with multilevel disc disease and facet disease but no acute cervical spine fracture. Electronically Signed   By: Rudie Meyer M.D.   On: 12/20/2019 11:38   DG Chest Portable 1 View  Result Date: 12/20/2019 CLINICAL DATA:  Dyspnea, fall EXAM: PORTABLE CHEST 1 VIEW COMPARISON:  None. FINDINGS: Diffuse bilateral interstitial and patchy alveolar airspace disease. No pleural effusion or pneumothorax. Stable cardiomegaly. No acute osseous abnormality. IMPRESSION: Bilateral diffuse interstitial thickening. There is an element of underlying chronic interstitial lung disease with likely superimposed mild interstitial edema. Electronically Signed   By: Elige Ko   On: 12/20/2019 11:20    ____________________________________________   PROCEDURES Procedures  ____________________________________________  DIFFERENTIAL DIAGNOSIS   Intracranial hemorrhage, C-spine fracture, UTI, electrolyte abnormality, pneumonia, COVID-19  CLINICAL IMPRESSION / ASSESSMENT AND  PLAN / ED COURSE  Medications ordered in the ED: Medications - No data to display  Pertinent labs & imaging results that were available during my care of the patient were reviewed by me and considered in my medical decision making (see chart for details).  Kyle Frederick was evaluated in Emergency Department on 12/20/2019 for the symptoms described in the history of present illness. He was evaluated in the context of the global COVID-19 pandemic, which necessitated consideration that the patient might be at risk for infection with the SARS-CoV-2 virus that causes COVID-19. Institutional protocols and algorithms that pertain to the evaluation of patients at risk for COVID-19 are in a state of rapid change based on information released by regulatory bodies including the CDC and federal and state organizations. These policies and algorithms were followed during the patient's care in the ED.   Patient presents with a fall from home.  Has evidence of contusion to left forehead.  CT scan of the head neck obtained which is negative for intracranial hemorrhage or C-spine fracture.  Chest x-ray negative for pneumonia pulmonary edema or pleural effusion.  Point-of-care Covid test negative, lab tests unremarkable except for mild leukocytosis of 14,000 and urinalysis showing some hematuria.  This raises suspicion for hemorrhagic cystitis, I will start him on Keflex and send urine culture.  Also nystatin for his fungal skin infection.  Follow-up with primary care.  Patient is comfortable with discharge home, which seems appropriate given that he has good full-time nursing support at home, vital signs unremarkable, work-up is overall reassuring.  Return precautions for any worsening symptoms or inability to complete ADLs at home with nursing assistance.      ____________________________________________   FINAL CLINICAL IMPRESSION(S) / ED DIAGNOSES    Final diagnoses:  Contusion of face, initial encounter   Candidiasis of skin  Hemorrhagic cystitis     ED Discharge Orders         Ordered    cephALEXin (KEFLEX) 500 MG capsule  3 times daily     12/20/19 1335    nystatin (MYCOSTATIN/NYSTOP) powder  3 times daily     12/20/19 1335          Portions of this note were generated with dragon dictation software. Dictation errors may occur despite best attempts at proofreading.   Sharman Cheek, MD 12/20/19 1345

## 2019-12-21 LAB — URINE CULTURE

## 2019-12-21 LAB — SARS CORONAVIRUS 2 (TAT 6-24 HRS): SARS Coronavirus 2: NEGATIVE

## 2020-02-13 IMAGING — CT CT CERVICAL SPINE W/O CM
3 of 4 series · 10 of 33 positions shown, 12 images · non-contrast
Comparison: Head CT from 8448.

CLINICAL DATA: Fell at home. Prior history of a stroke.

EXAM:
CT HEAD WITHOUT CONTRAST
CT CERVICAL SPINE WITHOUT CONTRAST
TECHNIQUE: Multidetector CT imaging of the head and cervical spine was
performed following the standard protocol without intravenous
contrast. Multiplanar CT image reconstructions of the cervical spine
were also generated.

[Series 3: c spine soft · axial · 0.38mm/px · z∈[-262,-202]mm · 2 of 76 slices shown, 3 images]
[im 23/76  soft-tissue]
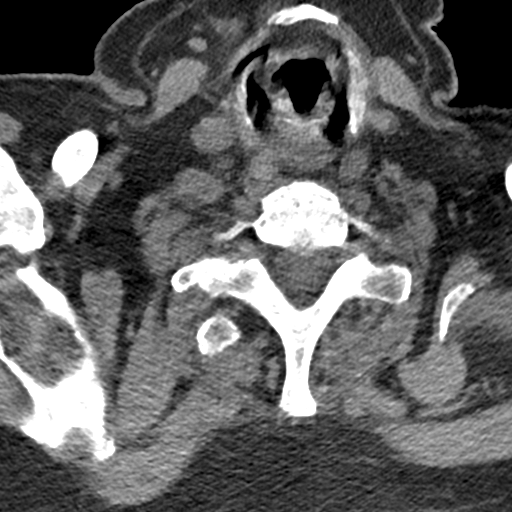
[im 23/76  bone]
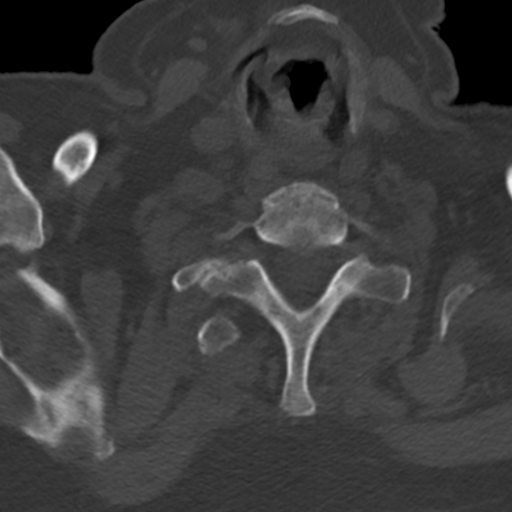
[im 53/76  bone]
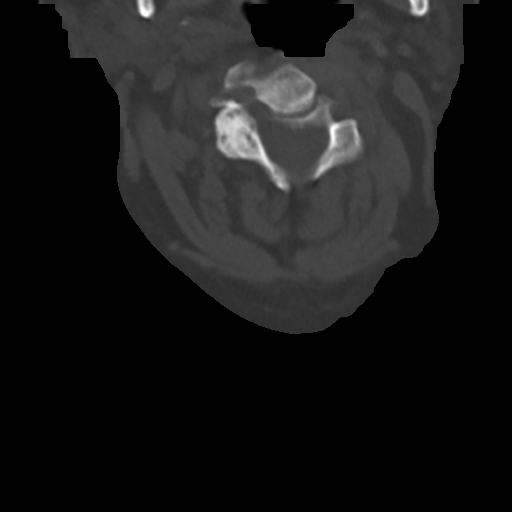

[Series 8: sagittal bone · sagittal · 0.27mm/px · 5 of 53 slices shown, 6 images]
[im 18/53  bone]
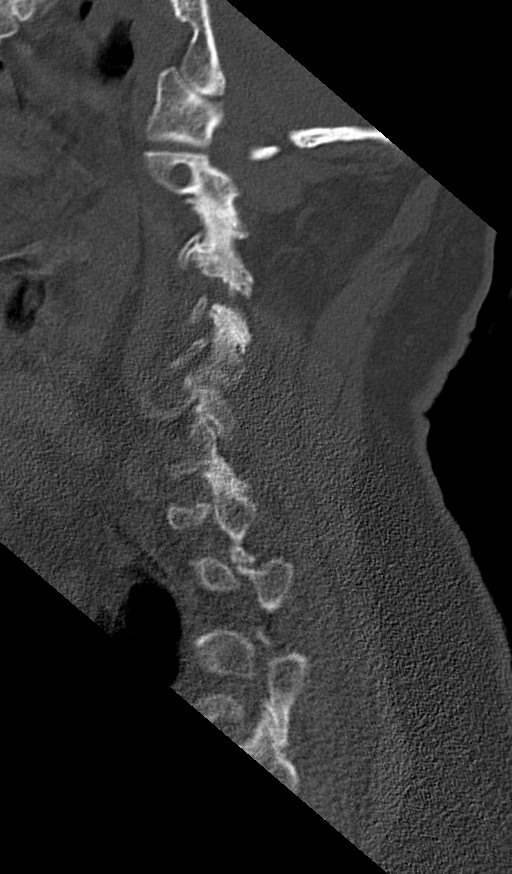
[im 22/53  bone]
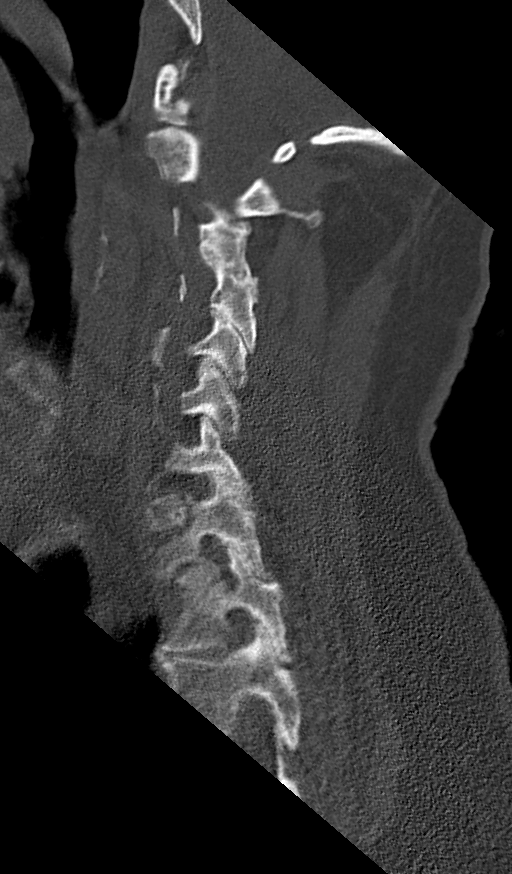
[im 27/53  soft-tissue]
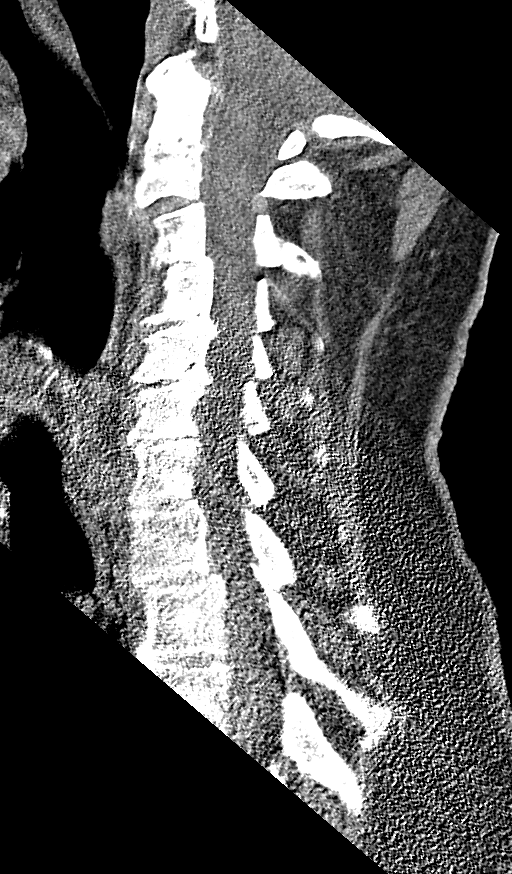
[im 27/53  bone]
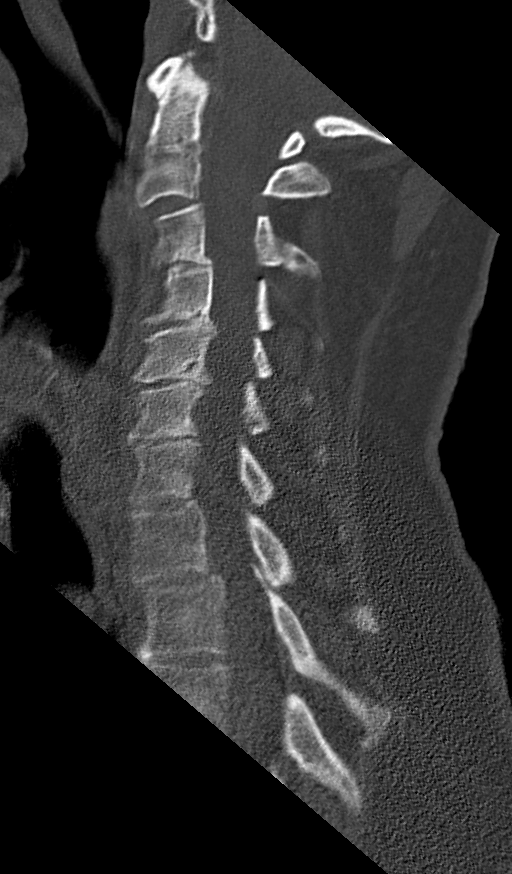
[im 31/53  bone]
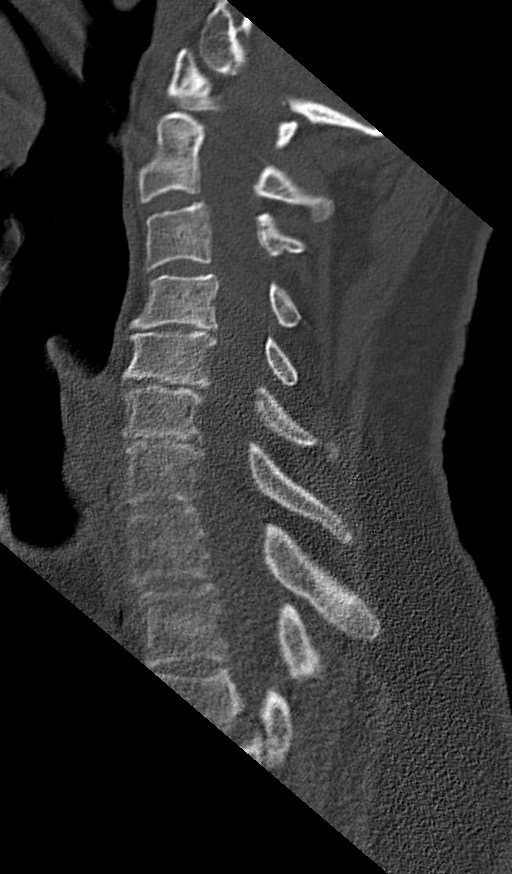
[im 35/53  bone]
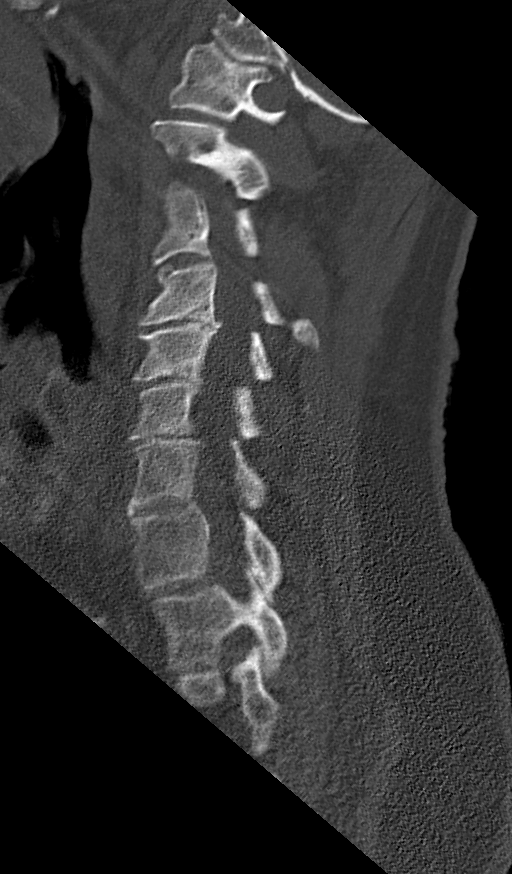

[Series 9: coronal bone · coronal · 0.23mm/px · 3 of 56 slices shown]
[im 16/56  bone]
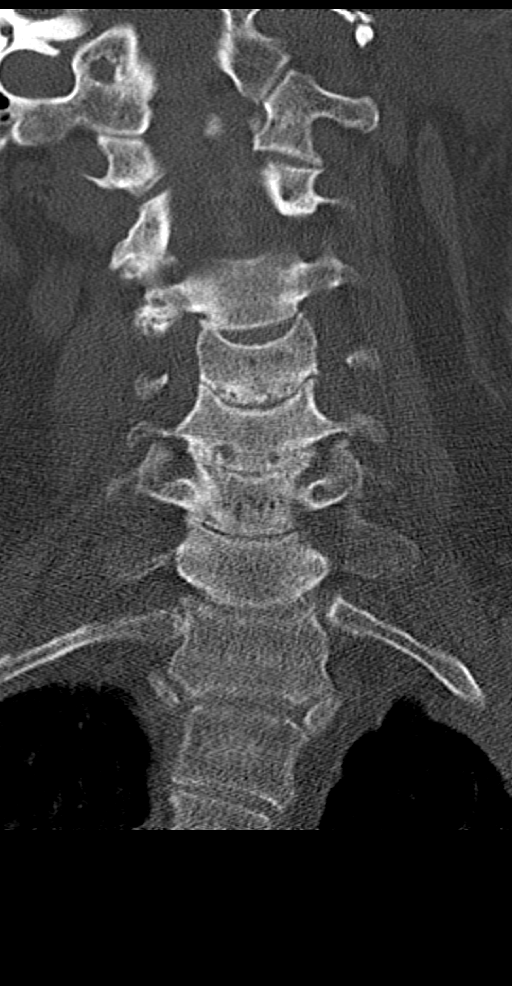
[im 24/56  bone]
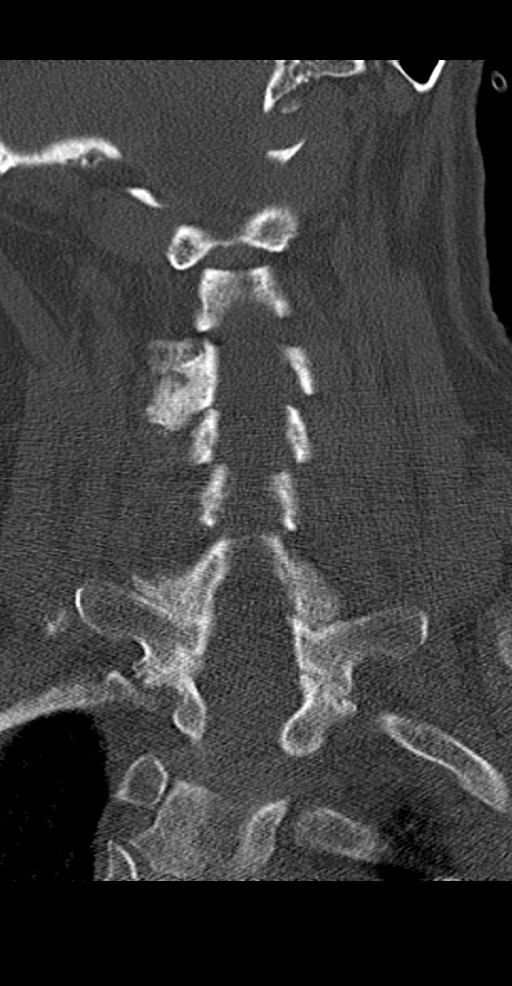
[im 32/56  bone]
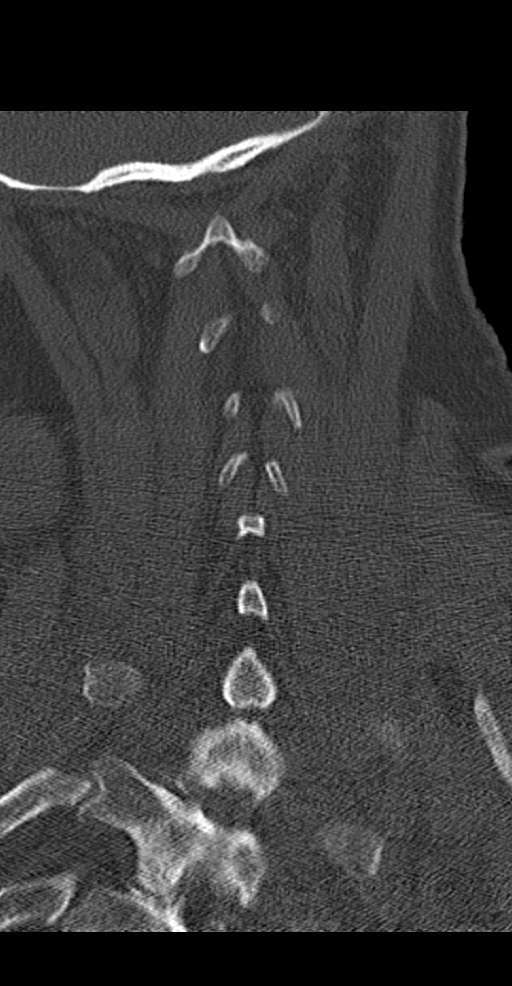

[10 of 33 positions shown; findings below may reference images not displayed]

FINDINGS: CT HEAD FINDINGS

Brain: Age related cerebral atrophy, ventriculomegaly and
periventricular white matter disease. Evidence of prior left-sided
infarcts with encephalomalacia and ex vacuo dilatation of the left
lateral ventricle.

No definite acute infarct or intracranial hemorrhage.

Vascular: No hyperdense vessels are identified. Scattered vascular
calcifications.

Skull: No skull fracture or bone lesions.

Sinuses/Orbits: The paranasal sinuses and mastoid air cells are
clear. The globes are intact.

Other: No scalp lesions or hematoma.

CT CERVICAL SPINE FINDINGS

Alignment: Advanced degenerative cervical spondylosis with
multilevel degenerative type subluxations.

Skull base and vertebrae: No acute fracture. No primary bone lesion
or focal pathologic process.

Soft tissues and spinal canal: No prevertebral fluid or swelling. No
visible canal hematoma.

Disc levels: Degenerative cervical spondylosis with multilevel disc
disease and facet disease. No significant spinal stenosis. Mild
multilevel foraminal stenosis due to uncinate spurring and facet
disease.

Upper chest: The lung apices are grossly clear. Tortuous ectatic
calcified vasculature noted.

Other: None
IMPRESSION: 1. Age related cerebral atrophy, ventriculomegaly and
periventricular white matter disease.
2. Remote left-sided infarcts.
3. No acute intracranial findings or skull fracture.
4. Advanced degenerative cervical spondylosis with multilevel disc
disease and facet disease but no acute cervical spine fracture.

## 2020-02-13 IMAGING — CT CT HEAD W/O CM
3 of 4 series · 15 of 47 positions shown, 18 images · non-contrast
Comparison: Head CT from 8448.

CLINICAL DATA: Fell at home. Prior history of a stroke.

EXAM:
CT HEAD WITHOUT CONTRAST
CT CERVICAL SPINE WITHOUT CONTRAST
TECHNIQUE: Multidetector CT imaging of the head and cervical spine was
performed following the standard protocol without intravenous
contrast. Multiplanar CT image reconstructions of the cervical spine
were also generated.

[Series 3: head wo · axial · 0.43mm/px · z∈[-159,-34]mm · 9 of 31 slices shown, 12 images]
[im 3/31  brain]
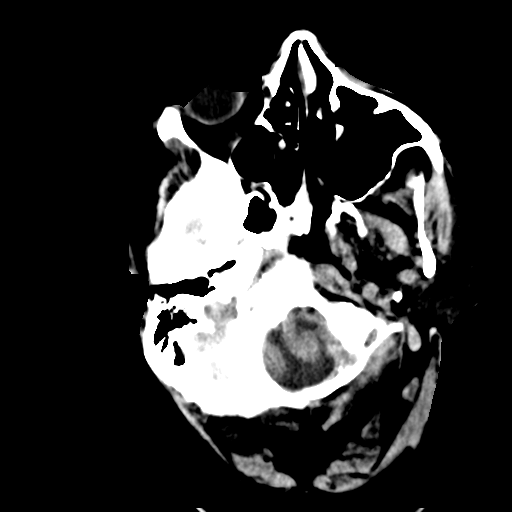
[im 3/31  bone]
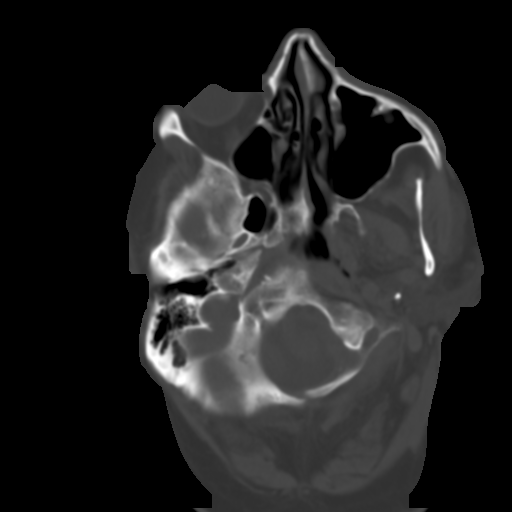
[im 7/31  brain]
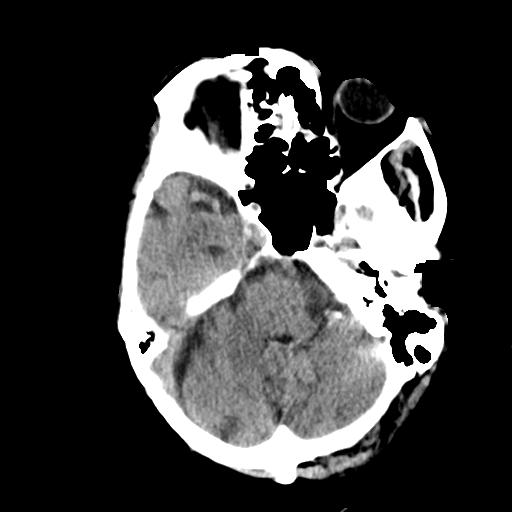
[im 9/31  brain]
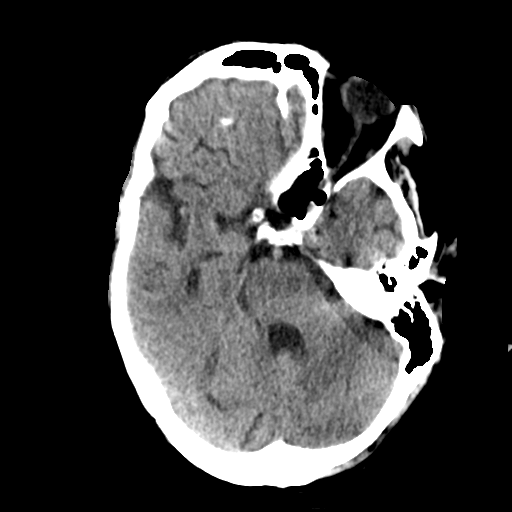
[im 13/31  brain]
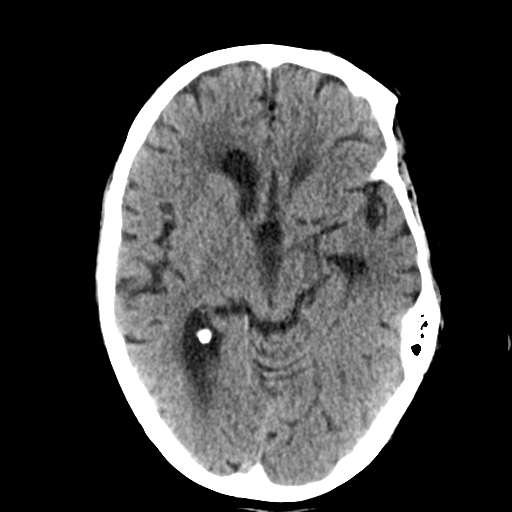
[im 16/31  brain]
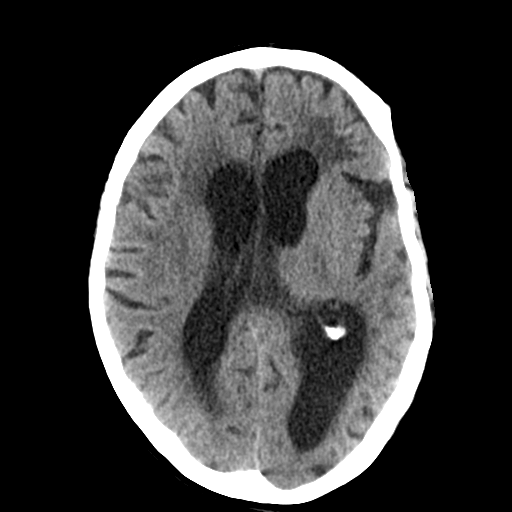
[im 16/31  bone]
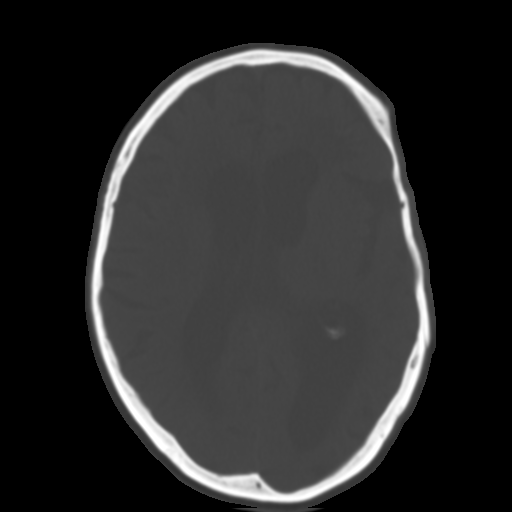
[im 18/31  brain]
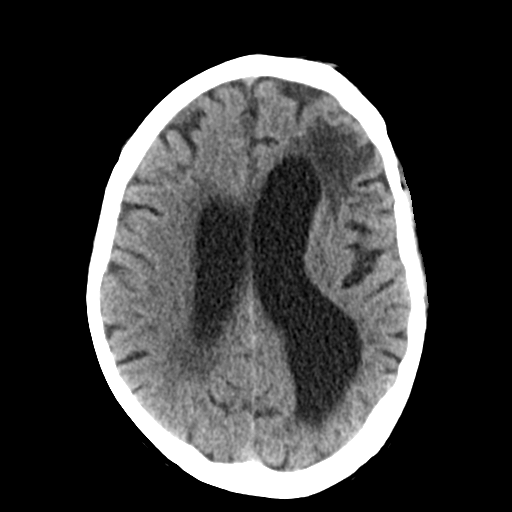
[im 22/31  brain]
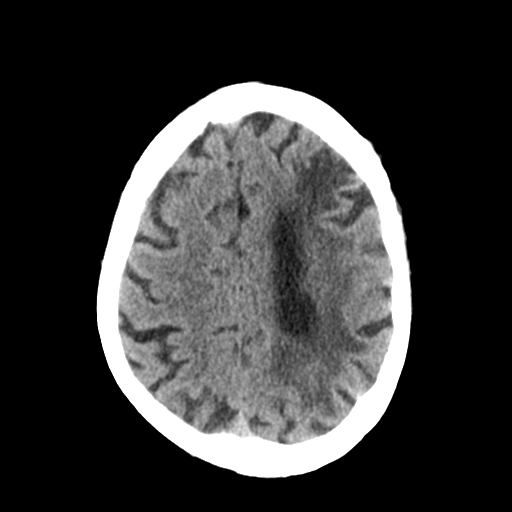
[im 24/31  brain]
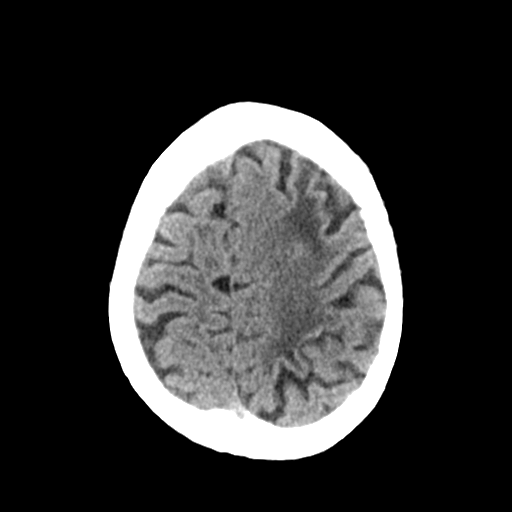
[im 28/31  brain]
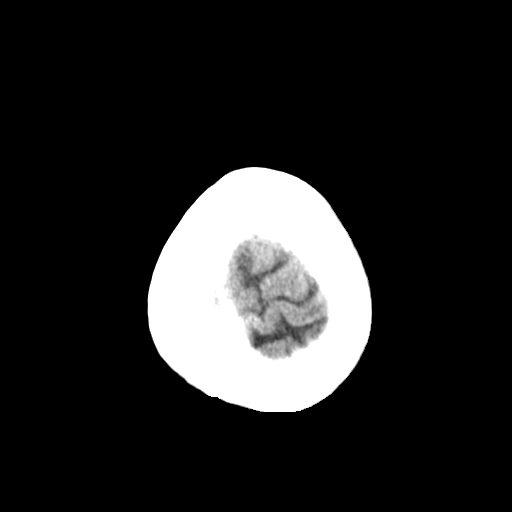
[im 28/31  bone]
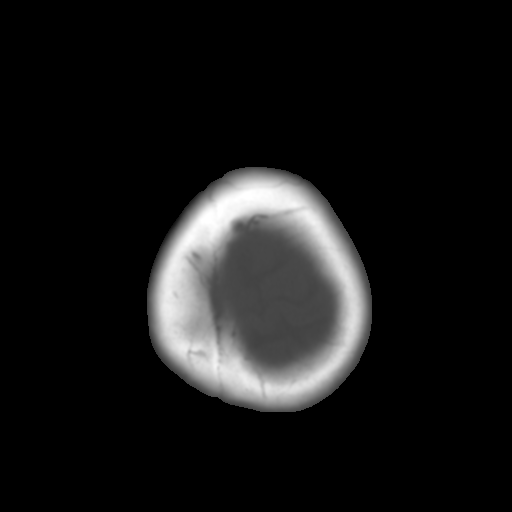

[Series 6: coronal soft tissue · coronal · 0.33mm/px · 3 of 65 slices shown]
[im 22/65  brain]
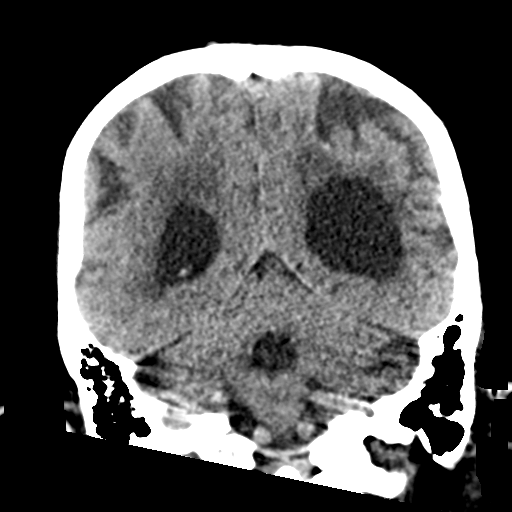
[im 29/65  brain]
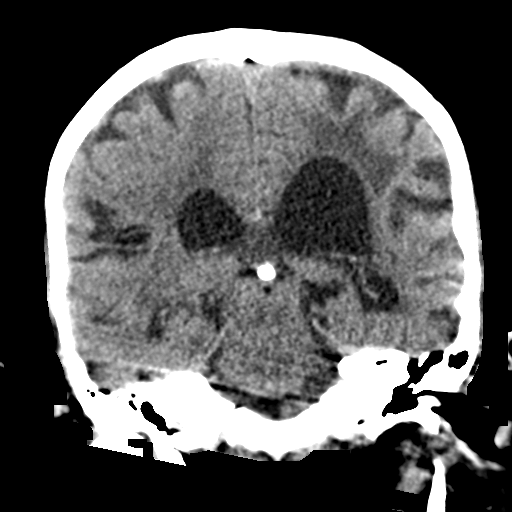
[im 36/65  brain]
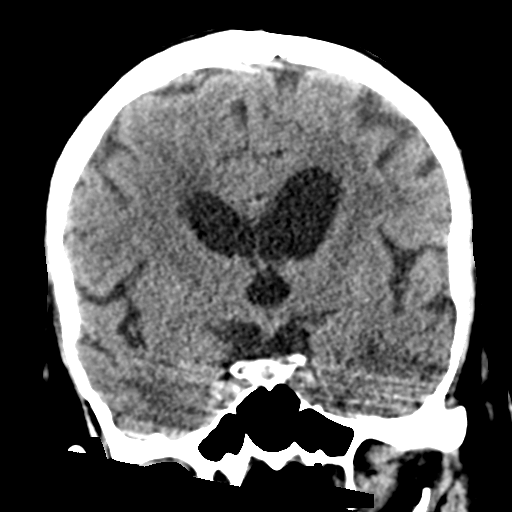

[Series 7: sagittal soft tissue · sagittal · 0.31mm/px · 3 of 52 slices shown]
[im 21/52  brain]
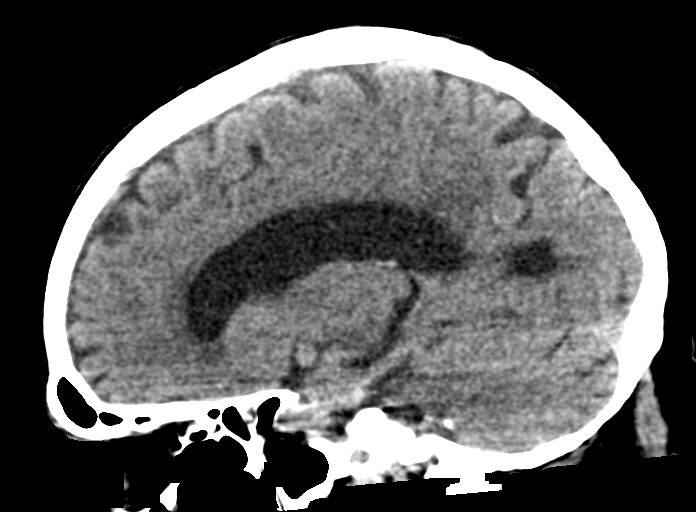
[im 26/52  brain]
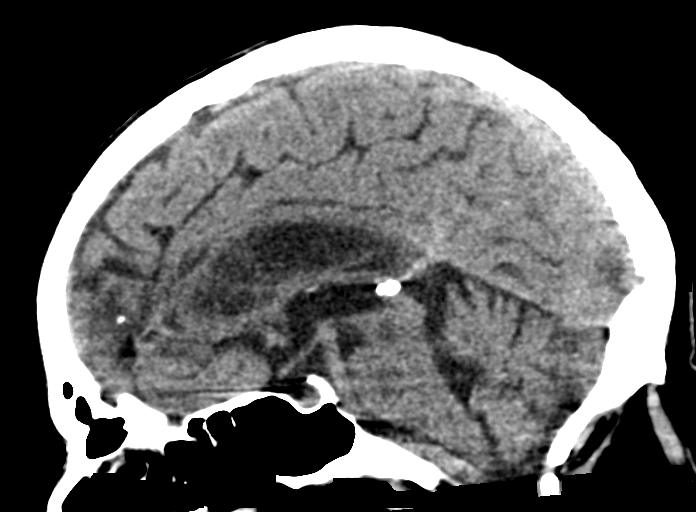
[im 32/52  brain]
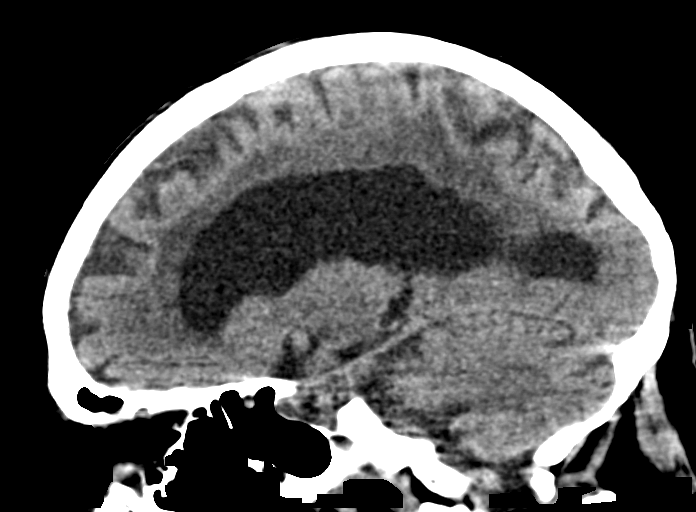

[15 of 47 positions shown; findings below may reference images not displayed]

FINDINGS: CT HEAD FINDINGS

Brain: Age related cerebral atrophy, ventriculomegaly and
periventricular white matter disease. Evidence of prior left-sided
infarcts with encephalomalacia and ex vacuo dilatation of the left
lateral ventricle.

No definite acute infarct or intracranial hemorrhage.

Vascular: No hyperdense vessels are identified. Scattered vascular
calcifications.

Skull: No skull fracture or bone lesions.

Sinuses/Orbits: The paranasal sinuses and mastoid air cells are
clear. The globes are intact.

Other: No scalp lesions or hematoma.

CT CERVICAL SPINE FINDINGS

Alignment: Advanced degenerative cervical spondylosis with
multilevel degenerative type subluxations.

Skull base and vertebrae: No acute fracture. No primary bone lesion
or focal pathologic process.

Soft tissues and spinal canal: No prevertebral fluid or swelling. No
visible canal hematoma.

Disc levels: Degenerative cervical spondylosis with multilevel disc
disease and facet disease. No significant spinal stenosis. Mild
multilevel foraminal stenosis due to uncinate spurring and facet
disease.

Upper chest: The lung apices are grossly clear. Tortuous ectatic
calcified vasculature noted.

Other: None
IMPRESSION: 1. Age related cerebral atrophy, ventriculomegaly and
periventricular white matter disease.
2. Remote left-sided infarcts.
3. No acute intracranial findings or skull fracture.
4. Advanced degenerative cervical spondylosis with multilevel disc
disease and facet disease but no acute cervical spine fracture.

## 2020-04-26 ENCOUNTER — Emergency Department
Admission: EM | Admit: 2020-04-26 | Discharge: 2020-04-28 | DRG: 189 | Disposition: A | Discharge status: Skilled Nursing Facility | Payer: Medicare Other | Source: Skilled Nursing Facility | Attending: Hospitalist | Admitting: Hospitalist

## 2020-04-26 ENCOUNTER — Other Ambulatory Visit: Payer: Self-pay

## 2020-04-26 ENCOUNTER — Emergency Department
Admission: EM | Admit: 2020-04-26 | Discharge: 2020-04-26 | Disposition: A | Discharge status: Skilled Nursing Facility | Payer: Medicare Other | Source: Home / Self Care | Attending: Emergency Medicine | Admitting: Emergency Medicine

## 2020-04-26 ENCOUNTER — Emergency Department: Payer: Medicare Other

## 2020-04-26 DIAGNOSIS — J9611 Chronic respiratory failure with hypoxia: Secondary | ICD-10-CM | POA: Diagnosis not present

## 2020-04-26 DIAGNOSIS — Z9981 Dependence on supplemental oxygen: Secondary | ICD-10-CM

## 2020-04-26 DIAGNOSIS — S0990XA Unspecified injury of head, initial encounter: Secondary | ICD-10-CM

## 2020-04-26 DIAGNOSIS — F329 Major depressive disorder, single episode, unspecified: Secondary | ICD-10-CM | POA: Diagnosis not present

## 2020-04-26 DIAGNOSIS — J449 Chronic obstructive pulmonary disease, unspecified: Secondary | ICD-10-CM | POA: Diagnosis not present

## 2020-04-26 DIAGNOSIS — I5031 Acute diastolic (congestive) heart failure: Secondary | ICD-10-CM | POA: Diagnosis present

## 2020-04-26 DIAGNOSIS — I498 Other specified cardiac arrhythmias: Secondary | ICD-10-CM | POA: Diagnosis not present

## 2020-04-26 DIAGNOSIS — I110 Hypertensive heart disease with heart failure: Secondary | ICD-10-CM | POA: Diagnosis not present

## 2020-04-26 DIAGNOSIS — I440 Atrioventricular block, first degree: Secondary | ICD-10-CM | POA: Diagnosis present

## 2020-04-26 DIAGNOSIS — Z20822 Contact with and (suspected) exposure to covid-19: Secondary | ICD-10-CM | POA: Diagnosis present

## 2020-04-26 DIAGNOSIS — Z888 Allergy status to other drugs, medicaments and biological substances status: Secondary | ICD-10-CM

## 2020-04-26 DIAGNOSIS — J4489 Other specified chronic obstructive pulmonary disease: Secondary | ICD-10-CM

## 2020-04-26 DIAGNOSIS — Z7982 Long term (current) use of aspirin: Secondary | ICD-10-CM

## 2020-04-26 DIAGNOSIS — Z7951 Long term (current) use of inhaled steroids: Secondary | ICD-10-CM

## 2020-04-26 DIAGNOSIS — G9341 Metabolic encephalopathy: Secondary | ICD-10-CM | POA: Diagnosis present

## 2020-04-26 DIAGNOSIS — I10 Essential (primary) hypertension: Secondary | ICD-10-CM

## 2020-04-26 DIAGNOSIS — R0689 Other abnormalities of breathing: Secondary | ICD-10-CM | POA: Diagnosis present

## 2020-04-26 DIAGNOSIS — W19XXXA Unspecified fall, initial encounter: Secondary | ICD-10-CM

## 2020-04-26 DIAGNOSIS — Z87891 Personal history of nicotine dependence: Secondary | ICD-10-CM

## 2020-04-26 DIAGNOSIS — J9622 Acute and chronic respiratory failure with hypercapnia: Secondary | ICD-10-CM

## 2020-04-26 DIAGNOSIS — F32A Depression, unspecified: Secondary | ICD-10-CM

## 2020-04-26 DIAGNOSIS — Z79899 Other long term (current) drug therapy: Secondary | ICD-10-CM

## 2020-04-26 DIAGNOSIS — R0602 Shortness of breath: Secondary | ICD-10-CM

## 2020-04-26 DIAGNOSIS — E874 Mixed disorder of acid-base balance: Secondary | ICD-10-CM | POA: Diagnosis not present

## 2020-04-26 DIAGNOSIS — I69320 Aphasia following cerebral infarction: Secondary | ICD-10-CM

## 2020-04-26 DIAGNOSIS — J849 Interstitial pulmonary disease, unspecified: Secondary | ICD-10-CM | POA: Diagnosis not present

## 2020-04-26 DIAGNOSIS — Z8673 Personal history of transient ischemic attack (TIA), and cerebral infarction without residual deficits: Secondary | ICD-10-CM

## 2020-04-26 LAB — CBC WITH DIFFERENTIAL/PLATELET
Abs Immature Granulocytes: 0.02 10*3/uL (ref 0.00–0.07)
Basophils Absolute: 0 10*3/uL (ref 0.0–0.1)
Basophils Relative: 0 %
Eosinophils Absolute: 0.1 10*3/uL (ref 0.0–0.5)
Eosinophils Relative: 1 %
HCT: 35.9 % — ABNORMAL LOW (ref 39.0–52.0)
Hemoglobin: 10.2 g/dL — ABNORMAL LOW (ref 13.0–17.0)
Immature Granulocytes: 0 %
Lymphocytes Relative: 14 %
Lymphs Abs: 1.2 10*3/uL (ref 0.7–4.0)
MCH: 25.2 pg — ABNORMAL LOW (ref 26.0–34.0)
MCHC: 28.4 g/dL — ABNORMAL LOW (ref 30.0–36.0)
MCV: 88.9 fL (ref 80.0–100.0)
Monocytes Absolute: 0.7 10*3/uL (ref 0.1–1.0)
Monocytes Relative: 8 %
Neutro Abs: 6.5 10*3/uL (ref 1.7–7.7)
Neutrophils Relative %: 77 %
Platelets: 194 10*3/uL (ref 150–400)
RBC: 4.04 MIL/uL — ABNORMAL LOW (ref 4.22–5.81)
RDW: 13.7 % (ref 11.5–15.5)
WBC: 8.4 10*3/uL (ref 4.0–10.5)
nRBC: 0 % (ref 0.0–0.2)

## 2020-04-26 LAB — COMPREHENSIVE METABOLIC PANEL
ALT: 13 U/L (ref 0–44)
ALT: 12 U/L (ref 0–44)
AST: 18 U/L (ref 15–41)
AST: 21 U/L (ref 15–41)
Albumin: 3.6 g/dL (ref 3.5–5.0)
Albumin: 3.9 g/dL (ref 3.5–5.0)
Alkaline Phosphatase: 58 U/L (ref 38–126)
Alkaline Phosphatase: 61 U/L (ref 38–126)
Anion gap: 7 (ref 5–15)
Anion gap: 9 (ref 5–15)
BUN: 27 mg/dL — ABNORMAL HIGH (ref 8–23)
BUN: 27 mg/dL — ABNORMAL HIGH (ref 8–23)
CO2: 38 mmol/L — ABNORMAL HIGH (ref 22–32)
CO2: 35 mmol/L — ABNORMAL HIGH (ref 22–32)
Calcium: 9.3 mg/dL (ref 8.9–10.3)
Calcium: 9.3 mg/dL (ref 8.9–10.3)
Chloride: 96 mmol/L — ABNORMAL LOW (ref 98–111)
Chloride: 97 mmol/L — ABNORMAL LOW (ref 98–111)
Creatinine, Ser: 1.14 mg/dL (ref 0.61–1.24)
Creatinine, Ser: 1.1 mg/dL (ref 0.61–1.24)
GFR calc Af Amer: >60 mL/min (ref >60)
GFR calc Af Amer: >60 mL/min (ref >60)
GFR calc non Af Amer: 58 mL/min — ABNORMAL LOW (ref >60)
GFR calc non Af Amer: >60 mL/min (ref >60)
Glucose, Bld: 103 mg/dL — ABNORMAL HIGH (ref 70–99)
Glucose, Bld: 98 mg/dL (ref 70–99)
Potassium: 4.3 mmol/L (ref 3.5–5.1)
Potassium: 4.3 mmol/L (ref 3.5–5.1)
Sodium: 141 mmol/L (ref 135–145)
Sodium: 141 mmol/L (ref 135–145)
Total Bilirubin: 0.6 mg/dL (ref 0.3–1.2)
Total Bilirubin: 0.6 mg/dL (ref 0.3–1.2)
Total Protein: 6.7 g/dL (ref 6.5–8.1)
Total Protein: 6.9 g/dL (ref 6.5–8.1)

## 2020-04-26 LAB — URINALYSIS, COMPLETE (UACMP) WITH MICROSCOPIC
Bacteria, UA: NONE SEEN
Bilirubin Urine: NEGATIVE
Glucose, UA: NEGATIVE mg/dL
Ketones, ur: NEGATIVE mg/dL
Leukocytes,Ua: NEGATIVE
Nitrite: NEGATIVE
Protein, ur: NEGATIVE mg/dL
RBC / HPF: >50 RBC/hpf — ABNORMAL HIGH (ref 0–5)
Specific Gravity, Urine: 1.013 (ref 1.005–1.030)
Squamous Epithelial / HPF: NONE SEEN (ref 0–5)
WBC, UA: NONE SEEN WBC/hpf (ref 0–5)
pH: 6 (ref 5.0–8.0)

## 2020-04-26 LAB — CBC
HCT: 35.6 % — ABNORMAL LOW (ref 39.0–52.0)
Hemoglobin: 10.5 g/dL — ABNORMAL LOW (ref 13.0–17.0)
MCH: 25.9 pg — ABNORMAL LOW (ref 26.0–34.0)
MCHC: 29.5 g/dL — ABNORMAL LOW (ref 30.0–36.0)
MCV: 87.9 fL (ref 80.0–100.0)
Platelets: 161 10*3/uL (ref 150–400)
RBC: 4.05 MIL/uL — ABNORMAL LOW (ref 4.22–5.81)
RDW: 13.6 % (ref 11.5–15.5)
WBC: 6.7 10*3/uL (ref 4.0–10.5)
nRBC: 0 % (ref 0.0–0.2)

## 2020-04-26 LAB — BRAIN NATRIURETIC PEPTIDE: B Natriuretic Peptide: 52.4 pg/mL (ref 0.0–100.0)

## 2020-04-26 LAB — TROPONIN I (HIGH SENSITIVITY): Troponin I (High Sensitivity): 17 ng/L (ref <18)

## 2020-04-26 MED ORDER — IPRATROPIUM-ALBUTEROL 0.5-2.5 (3) MG/3ML IN SOLN
3.0000 mL | Freq: Once | RESPIRATORY_TRACT | Status: AC
  Administered 2020-04-26: 3 mL via RESPIRATORY_TRACT
  Filled 2020-04-26: qty 3

## 2020-04-26 MED ORDER — FENTANYL 2500MCG IN NS 250ML (10MCG/ML) PREMIX INFUSION: 0.0000 ug/h | INTRAVENOUS | Status: DC

## 2020-04-26 NOTE — ED Provider Notes (Signed)
[facility] Emergency Department Provider Note  ____________________________________________   First MD Initiated Contact with Patient 04/26/20 0425     (approximate)  I have reviewed the triage vital signs and the nursing notes.   HISTORY  Chief Complaint Fall    HPI Kyle Frederick is a 86 y.o. male presents to the emergency department via EMS following an unwitnessed fall at [place].  EMS states that the patient fell while attempting to get out of bed into his wheelchair patient attest to the same.  Patient is alert and oriented x2 at present.  Patient denies any pain patient denies any head injury however abrasion to the nasal philtrum.        Past Medical History:  Diagnosis Date  . CHF (congestive heart failure) ([place])   . COPD (chronic obstructive pulmonary disease) ([place])   . Stroke [facility])     Patient Active Problem List   Diagnosis Date Noted  . Acute diastolic CHF (congestive heart failure) ([place]) 04/28/2018  . Acute exacerbation of chronic obstructive pulmonary disease (COPD) ([place]) 04/25/2018  . CVA, old, aphasia 04/25/2018  . Interstitial lung disease ([place]) 04/25/2018  . Pressure injury of skin 04/25/2018  . Anemia due to chronic illness 11/12/2017  . Hypertension 06/21/2014    History reviewed. No pertinent surgical history.  Prior to Admission medications   Medication Sig Start Date End Date Taking? Authorizing Provider  acetaminophen (TYLENOL) 650 MG CR tablet Take 2 tablets by mouth every 8 (eight) hours as needed.    [provider]  AMBULATORY NON FORMULARY MEDICATION Medication Name: Please dispense nebulizer and supplies. Medications sent to pharmacy. DX J44.9 DME: [facility] 05/05/18   Laverle Hobby, MD  AMBULATORY NON FORMULARY MEDICATION Medication Name: Please dispense flutter valve. Use after each nebulizer treatment. DME: [facility] DX: J44.9 05/05/18   Laverle Hobby, MD  arformoterol (BROVANA) 15  MCG/2ML NEBU USE 1 VIAL IN NEBULIZER TWO TIMES DAILY. 02/21/19   Laverle Hobby, MD  aspirin 81 MG EC tablet Take 1 tablet by mouth daily.     [provider]  budesonide (PULMICORT) 0.5 MG/2ML nebulizer solution USE 1 VIAL IN NEBULIZER TWO TIMES DAILY. 04/06/19   Laverle Hobby, MD  cephALEXin (KEFLEX) 500 MG capsule Take 1 capsule (500 mg total) by mouth 3 (three) times daily. 12/20/19   Carrie Mew, MD  ipratropium-albuterol (DUONEB) 0.5-2.5 (3) MG/3ML SOLN Take 3 mLs by nebulization every 6 (six) hours as needed (sob wheezing). 04/28/18   Bettey Costa, MD  polyethylene glycol (MIRALAX / GLYCOLAX) packet Take 1 packet by mouth daily as needed.    [provider]  simvastatin (ZOCOR) 20 MG tablet Take 1 tablet by mouth daily. 02/09/18   [provider]    Allergies Lasix [furosemide]  No family history on file.  Social History Social History   Tobacco Use  . Smoking status: Former Research scientist (life sciences)  . Smokeless tobacco: Never Used  Substance Use Topics  . Alcohol use: Never  . Drug use: Never    Review of Systems Constitutional: No fever/chills Eyes: No visual changes. ENT: No sore throat. Cardiovascular: Denies chest pain. Respiratory: Denies shortness of breath. Gastrointestinal: No abdominal pain.  No nausea, no vomiting.  No diarrhea.  No constipation. Genitourinary: Negative for dysuria. Musculoskeletal: Negative for neck pain.  Negative for back pain. Integumentary: Negative for rash. Neurological: Negative for headaches, focal weakness or numbness.   ____________________________________________   PHYSICAL EXAM:  VITAL SIGNS: ED Triage Vitals  Enc Vitals  Group     BP 04/26/20 0426 138/81     Pulse Rate 04/26/20 0426 (!) 56     Resp 04/26/20 0426 20     Temp --      Temp src --      SpO2 04/26/20 0426 92 %     Weight 04/26/20 0427 114.6 kg (252 lb 10.4 oz)     Height 04/26/20 0427 1.854 m (6\' 1" )     Head Circumference --       Peak Flow --      Pain Score 04/26/20 0427 0     Pain Loc --      Pain Edu? --      Excl. in GC? --     Constitutional: Alert and oriented. Eyes: Conjunctivae are normal.  Head: Abrasion noted at the nasal philtrum Nose: No congestion/rhinnorhea.  Abrasion inferior to the nose Mouth/Throat: Patient is wearing a mask. Neck: No stridor.  No meningeal signs.   Cardiovascular: Normal rate, regular rhythm. Good peripheral circulation. Grossly normal heart sounds. Respiratory: Normal respiratory effort.  No retractions. Gastrointestinal: Soft and nontender. No distention.  Musculoskeletal: No lower extremity tenderness nor edema. No gross deformities of extremities. Neurologic:  Normal speech and language. No gross focal neurologic deficits are appreciated.  Skin:  Skin is warm, dry and abrasion inferior to the nose Psychiatric: Mood and affect are normal. Speech and behavior are normal.  ____________________________________________   LABS (all labs ordered are listed, but only abnormal results are displayed)  Labs Reviewed  CBC - Abnormal; Notable for the following components:      Result Value   RBC 4.05 (*)    Hemoglobin 10.5 (*)    HCT 35.6 (*)    MCH 25.9 (*)    MCHC 29.5 (*)    All other components within normal limits  COMPREHENSIVE METABOLIC PANEL - Abnormal; Notable for the following components:   Chloride 96 (*)    CO2 38 (*)    Glucose, Bld 103 (*)    BUN 27 (*)    GFR calc non Af Amer 58 (*)    All other components within normal limits  URINALYSIS, COMPLETE (UACMP) WITH MICROSCOPIC - Abnormal; Notable for the following components:   Color, Urine YELLOW (*)    APPearance HAZY (*)    Hgb urine dipstick LARGE (*)    RBC / HPF >50 (*)    All other components within normal limits  TROPONIN I (HIGH SENSITIVITY)   ____________________________________________  EKG   RADIOLOGY I, [place] Shin[place]a [place], personally viewed and evaluated these images (plain radiographs) as  part of my medical decision making, as well as reviewing the written report by the radiologist.  ED MD interpretation: CT head cervical spine revealed no acute intracranial or cervical injury.  Official radiology report(s): CT Head Wo Contrast  Result Date: 04/26/2020 CLINICAL DATA:  Fall with head trauma EXAM: CT HEAD WITHOUT CONTRAST CT CERVICAL SPINE WITHOUT CONTRAST TECHNIQUE: Multidetector CT imaging of the head and cervical spine was performed following the standard protocol without intravenous contrast. Multiplanar CT image reconstructions of the cervical spine were also generated. COMPARISON:  12/20/2019 FINDINGS: CT HEAD FINDINGS Brain: No evidence of acute infarction, hemorrhage, hydrocephalus, extra-axial collection or mass lesion/mass effect. Remote left frontal parietal infarcts in a MCA/watershed distribution. Atrophy with ventriculomegaly. Chronic small vessel ischemia in the cerebral white matter. Vascular: No hyperdense vessel or unexpected calcification. Skull: Negative for fracture. Sinuses/Orbits: Bilateral cataract resection CT CERVICAL SPINE FINDINGS Alignment:  Mild scoliosis. Degenerative, mild C3-4 and T1-2 anterolisthesis. Skull base and vertebrae: No acute fracture. No primary bone lesion or focal pathologic process. Soft tissues and spinal canal: No prevertebral fluid or swelling. No visible canal hematoma. Disc levels:  Diffuse degenerative disc narrowing and ridging. Upper chest: No acute finding IMPRESSION: 1. No evidence of acute intracranial or cervical spine injury. 2. Atrophy and remote left cerebral infarcts. Electronically Signed   By: Marnee Spring M.D.   On: 04/26/2020 05:54   CT Cervical Spine Wo Contrast  Result Date: 04/26/2020 CLINICAL DATA:  Fall with head trauma EXAM: CT HEAD WITHOUT CONTRAST CT CERVICAL SPINE WITHOUT CONTRAST TECHNIQUE: Multidetector CT imaging of the head and cervical spine was performed following the standard protocol without intravenous  contrast. Multiplanar CT image reconstructions of the cervical spine were also generated. COMPARISON:  12/20/2019 FINDINGS: CT HEAD FINDINGS Brain: No evidence of acute infarction, hemorrhage, hydrocephalus, extra-axial collection or mass lesion/mass effect. Remote left frontal parietal infarcts in a MCA/watershed distribution. Atrophy with ventriculomegaly. Chronic small vessel ischemia in the cerebral white matter. Vascular: No hyperdense vessel or unexpected calcification. Skull: Negative for fracture. Sinuses/Orbits: Bilateral cataract resection CT CERVICAL SPINE FINDINGS Alignment: Mild scoliosis. Degenerative, mild C3-4 and T1-2 anterolisthesis. Skull base and vertebrae: No acute fracture. No primary bone lesion or focal pathologic process. Soft tissues and spinal canal: No prevertebral fluid or swelling. No visible canal hematoma. Disc levels:  Diffuse degenerative disc narrowing and ridging. Upper chest: No acute finding IMPRESSION: 1. No evidence of acute intracranial or cervical spine injury. 2. Atrophy and remote left cerebral infarcts. Electronically Signed   By: Marnee Spring M.D.   On: 04/26/2020 05:54    ______ Procedures   ____________________________________________   INITIAL IMPRESSION / MDM / ASSESSMENT AND PLAN / ED COURSE  As part of my medical decision making, I reviewed the following data within the electronic MEDICAL RECORD NUMBER  86 year old male presented with above-stated history and physical exam differential diagnosis including but not limited to traumatic head injury secondary to fall, facial fracture.  CT scan of the head and cervical spine unremarkable.  Patient's laboratory data likewise negative. ____________________________________________  FINAL CLINICAL IMPRESSION(S) / ED DIAGNOSES  Final diagnoses:  Injury of head, initial encounter  Fall, initial encounter     MEDICATIONS GIVEN DURING THIS VISIT:  Medications - No data to display   ED Discharge Orders     None      *Please note:  Kyle Frederick was evaluated in Emergency Department on 04/26/2020 for the symptoms described in the history of present illness. He was evaluated in the context of the global COVID-19 pandemic, which necessitated consideration that the patient might be at risk for infection with the SARS-CoV-2 virus that causes COVID-19. Institutional protocols and algorithms that pertain to the evaluation of patients at risk for COVID-19 are in a state of rapid change based on information released by regulatory bodies including the CDC and federal and state organizations. These policies and algorithms were followed during the patient's care in the ED.  Some ED evaluations and interventions may be delayed as a result of limited staffing during the pandemic.*  Note:  This document was prepared using Dragon voice recognition software and may include unintentional dictation errors.   Darci Current, MD 04/26/20 2342

## 2020-04-26 NOTE — ED Notes (Signed)
This RN to bedside due to patient calling out. Pt states "I've peed". Pt cleansed of urine incontinence by this RN and Erskine Squibb, RN. Call bell placed within reach at this time. VS obtained. Pt tolerated well. Pt denies further needs. Pt remains on 3L via [place] at this time.

## 2020-04-26 NOTE — ED Notes (Signed)
Patient's reports he has to urinate. Patient taken to the bathroom by this RN and John NT. Patient's incontinence brief and boxer shorts wet. Patient changed into clean incontinence brief and blue scrub pants by this RN and John NT. Patient brought back to bed. Patient's boxers placed into labeled patient belonging's bag.

## 2020-04-26 NOTE — ED Triage Notes (Signed)
See first nurse note: Pt states to this RN "I want to end it all". Pt reports he has been feeling this way for a "long time". Pt wearing 3L O2 at this time. Pt legs swollen and right leg is wrapped with kerlex. PT slow to answer questions asked by this RN.

## 2020-04-26 NOTE — ED Notes (Signed)
Pt transported back to [facility] via [facility] at this time. Pt visualized in NAD at this time.

## 2020-04-26 NOTE — ED Notes (Signed)
Report given to Noah RN

## 2020-04-26 NOTE — ED Notes (Signed)
This RN to bedside due to patient calling out, pt states, "when am I getting out of here?" This RN explained to patient awaiting EMS transport. Pt states understanding at this time.

## 2020-04-26 NOTE — ED Triage Notes (Signed)
Pt to the er via ems for a fall out of bed attempting to get into his wheelchair. Pt is alert and oriented x 2. Pt denies pain at this time. Denies head injury. Pt has redness to the face under his nose. Pt right leg is wrapped in coban from the knee down. No info given to EMS by staff. Pt has pain when palpated to the left ankle.

## 2020-04-26 NOTE — ED Notes (Signed)
Pt's stepdaughter sitting with pt in lobby at this time

## 2020-04-26 NOTE — ED Triage Notes (Signed)
Pt comes into the ED via EMS from [facility] , states when staff checked on him he had taken off his O2 and when asked he stated he was having suicidal thoughts. 140/72, 60HR , 96% 3L [place] continuously.

## 2020-04-26 NOTE — ED Notes (Signed)
[facility] notified of pending arrival back to facility.

## 2020-04-26 NOTE — ED Notes (Signed)
Xray tech at bedside.

## 2020-04-26 NOTE — ED Notes (Signed)
Discussed pt with Dr. Derrill Kay, no labwork needed at this time

## 2020-04-26 NOTE — ED Provider Notes (Signed)
[facility] Emergency Department Provider Note    First MD Initiated Contact with Patient 04/26/20 2307     (approximate)  I have reviewed the triage vital signs and the nursing notes.   HISTORY  Chief Complaint Suicidal    HPI DAMEIAN CRISMAN is a 86 y.o. male with a history of CHF and COPD presents to the ER for shortness of breath.  Reportedly was found by [place] staff without his oxygen and when asked patient told staffers that "he no longer wants to be here.  ".  He was sent for suicidal ideation but when I evaluate the patient he denies any sort of SI or feelings of depression.  States that he is frustrated with his chronic comorbidities but has no intent for self-harm.  States he has been feeling more short of breath.  States has been compliant with his medications.  Denies any chest pain.  No fevers.    Past Medical History:  Diagnosis Date  . CHF (congestive heart failure) ([place])   . COPD (chronic obstructive pulmonary disease) ([place])   . Stroke [facility])    History reviewed. No pertinent family history. History reviewed. No pertinent surgical history. Patient Active Problem List   Diagnosis Date Noted  . Acute diastolic CHF (congestive heart failure) ([place]) 04/28/2018  . Acute exacerbation of chronic obstructive pulmonary disease (COPD) ([place]) 04/25/2018  . CVA, old, aphasia 04/25/2018  . Interstitial lung disease ([place]) 04/25/2018  . Pressure injury of skin 04/25/2018  . Anemia due to chronic illness 11/12/2017  . Hypertension 06/21/2014      Prior to Admission medications   Medication Sig Start Date End Date Taking? Authorizing Provider  acetaminophen (TYLENOL) 650 MG CR tablet Take 2 tablets by mouth every 8 (eight) hours as needed.    [provider]  AMBULATORY NON FORMULARY MEDICATION Medication Name: Please dispense nebulizer and supplies. Medications sent to pharmacy. DX J44.9 DME: [facility] 05/05/18   Laverle Hobby,  MD  AMBULATORY NON FORMULARY MEDICATION Medication Name: Please dispense flutter valve. Use after each nebulizer treatment. DME: [facility] DX: J44.9 05/05/18   Laverle Hobby, MD  arformoterol (BROVANA) 15 MCG/2ML NEBU USE 1 VIAL IN NEBULIZER TWO TIMES DAILY. 02/21/19   Laverle Hobby, MD  aspirin 81 MG EC tablet Take 1 tablet by mouth daily.     [provider]  budesonide (PULMICORT) 0.5 MG/2ML nebulizer solution USE 1 VIAL IN NEBULIZER TWO TIMES DAILY. 04/06/19   Laverle Hobby, MD  cephALEXin (KEFLEX) 500 MG capsule Take 1 capsule (500 mg total) by mouth 3 (three) times daily. 12/20/19   Carrie Mew, MD  ipratropium-albuterol (DUONEB) 0.5-2.5 (3) MG/3ML SOLN Take 3 mLs by nebulization every 6 (six) hours as needed (sob wheezing). 04/28/18   Bettey Costa, MD  polyethylene glycol (MIRALAX / GLYCOLAX) packet Take 1 packet by mouth daily as needed.    [provider]  simvastatin (ZOCOR) 20 MG tablet Take 1 tablet by mouth daily. 02/09/18   [provider]    Allergies Lasix [furosemide]    Social History Social History   Tobacco Use  . Smoking status: Former Research scientist (life sciences)  . Smokeless tobacco: Never Used  Substance Use Topics  . Alcohol use: Never  . Drug use: Never    Review of Systems Patient denies headaches, rhinorrhea, blurry vision, numbness, shortness of breath, chest pain, edema, cough, abdominal pain, nausea, vomiting, diarrhea, dysuria, fevers, rashes or hallucinations unless otherwise stated above in HPI. ____________________________________________   PHYSICAL  EXAM:  VITAL SIGNS: Vitals:   04/26/20 1728 04/26/20 2107  BP: (!) 144/53 (!) 156/61  Pulse: (!) 56 60  Resp: 20 20  Temp: 98.5 F (36.9 C)   SpO2: 99% 100%    Constitutional: Alert and oriented.  Eyes: Conjunctivae are normal.  Head: Atraumatic. Nose: No congestion/rhinnorhea. Mouth/Throat: Mucous membranes are moist.   Neck: No stridor. Painless ROM.    Cardiovascular: Normal rate, regular rhythm. Grossly normal heart sounds.  Good peripheral circulation. Respiratory: Normal respiratory effort.  No retractions. Lungs CTAB. Gastrointestinal: Soft and nontender. No distention. No abdominal bruits. No CVA tenderness. Genitourinary:  Musculoskeletal: No lower extremity tenderness, 2+ BLE edema.  No joint effusions. Neurologic:  Normal speech and language. No gross focal neurologic deficits are appreciated. No facial droop Skin:  Skin is warm, dry and intact. No rash noted. Psychiatric: Mood and affect are normal. Speech and behavior are normal.  No SI or HI.  ____________________________________________   LABS (all labs ordered are listed, but only abnormal results are displayed)  Results for orders placed or performed during the hospital encounter of 04/26/20 (from the past 24 hour(s))  CBC with Differential/Platelet     Status: Abnormal   Collection Time: 04/26/20  8:02 PM  Result Value Ref Range   WBC 8.4 4.0 - 10.5 K/uL   RBC 4.04 (L) 4.22 - 5.81 MIL/uL   Hemoglobin 10.2 (L) 13.0 - 17.0 g/dL   HCT 17.9 (L) 15.0 - 56.9 %   MCV 88.9 80.0 - 100.0 fL   MCH 25.2 (L) 26.0 - 34.0 pg   MCHC 28.4 (L) 30.0 - 36.0 g/dL   RDW 79.4 80.1 - 65.5 %   Platelets 194 150 - 400 K/uL   nRBC 0.0 0.0 - 0.2 %   Neutrophils Relative % 77 %   Neutro Abs 6.5 1.7 - 7.7 K/uL   Lymphocytes Relative 14 %   Lymphs Abs 1.2 0.7 - 4.0 K/uL   Monocytes Relative 8 %   Monocytes Absolute 0.7 0.1 - 1.0 K/uL   Eosinophils Relative 1 %   Eosinophils Absolute 0.1 0.0 - 0.5 K/uL   Basophils Relative 0 %   Basophils Absolute 0.0 0.0 - 0.1 K/uL   Immature Granulocytes 0 %   Abs Immature Granulocytes 0.02 0.00 - 0.07 K/uL  Comprehensive metabolic panel     Status: Abnormal   Collection Time: 04/26/20  8:02 PM  Result Value Ref Range   Sodium 141 135 - 145 mmol/L   Potassium 4.3 3.5 - 5.1 mmol/L   Chloride 97 (L) 98 - 111 mmol/L   CO2 35 (H) 22 - 32 mmol/L    Glucose, Bld 98 70 - 99 mg/dL   BUN 27 (H) 8 - 23 mg/dL   Creatinine, Ser 3.74 0.61 - 1.24 mg/dL   Calcium 9.3 8.9 - 82.7 mg/dL   Total Protein 6.9 6.5 - 8.1 g/dL   Albumin 3.9 3.5 - 5.0 g/dL   AST 21 15 - 41 U/L   ALT 12 0 - 44 U/L   Alkaline Phosphatase 61 38 - 126 U/L   Total Bilirubin 0.6 0.3 - 1.2 mg/dL   GFR calc non Af Amer >60 >60 mL/min   GFR calc Af Amer >60 >60 mL/min   Anion gap 9 5 - 15  Brain natriuretic peptide     Status: None   Collection Time: 04/26/20  8:53 PM  Result Value Ref Range   B Natriuretic Peptide 52.4 0.0 - 100.0 pg/mL  ____________________________________________  EKG My review and personal interpretation at Time: 23:18   Indication: sob  Rate: 60  Rhythm: sinus Axis: normal Other: normal intervals, no  ____________________________________________  RADIOLOGY  I personally reviewed all radiographic images ordered to evaluate for the above acute complaints and reviewed radiology reports and findings.  These findings were personally discussed with the patient.  Please see medical record for radiology report.  ____________________________________________   PROCEDURES  Procedure(s) performed:  Procedures    Critical Care performed: no ____________________________________________   INITIAL IMPRESSION / ASSESSMENT AND PLAN / ED COURSE  Pertinent labs & imaging results that were available during my care of the patient were reviewed by me and considered in my medical decision making (see chart for details).   DDX: Asthma, copd, CHF, pna, ptx, malignancy, Pe, anemia   HARTLEY URTON is a 86 y.o. who presents to the ED with symptoms as described above.  Patient on his baseline nasal cannula satting normal.  Denies any chest pain.  Does have lower extremity edema has some scattered crackles in posterior lung fields as well as some wheezing anteriorly.  Will give nebs.  He adamantly denies any SI.  I do not feel that he meets criteria for IVC.   This seems to be more of a medical encounter and evaluation.  Does have some elevation of his bicarb which may be secondary to contraction alkalosis versus hypercapnia.  Have ordered a VBG as well as a Covid his chest x-ray with possible edema versus atypical pneumonia.  Does not seem consistent with bacterial pneumonia.  I will lower suspicion for CHF as his BNP is normal.  Patient will be signed out to oncoming physician pending follow-up on labs and reassessment.     The patient was evaluated in Emergency Department today for the symptoms described in the history of present illness. He/she was evaluated in the context of the global COVID-19 pandemic, which necessitated consideration that the patient might be at risk for infection with the SARS-CoV-2 virus that causes COVID-19. Institutional protocols and algorithms that pertain to the evaluation of patients at risk for COVID-19 are in a state of rapid change based on information released by regulatory bodies including the CDC and federal and state organizations. These policies and algorithms were followed during the patient's care in the ED.  As part of my medical decision making, I reviewed the following data within the electronic MEDICAL RECORD NUMBER Nursing notes reviewed and incorporated, Labs reviewed, notes from prior ED visits and [place] Controlled Substance Database   ____________________________________________   FINAL CLINICAL IMPRESSION(S) / ED DIAGNOSES  Final diagnoses:  Shortness of breath      NEW MEDICATIONS STARTED DURING THIS VISIT:  New Prescriptions   No medications on file     Note:  This document was prepared using Dragon voice recognition software and may include unintentional dictation errors.    Willy Eddy, MD 04/26/20 [phone]

## 2020-04-26 NOTE — ED Provider Notes (Addendum)
-----------------------------------------   11:43 PM on 04/26/2020 -----------------------------------------  Assuming care from Dr. Roxan Hockey.  In short, Kyle Frederick is a 86 y.o. male with a chief complaint of shortness of breath and questionable depression.  Refer to the original H&P for additional details.  The current plan of care is to reassess after VBG and COVID test.  No concerns for SI, does not pose a risk to himself nor anyone else, does not meet criteria for additional psychiatric evaluation and certainly not for IVC/inpatient psych treatment.  Patient's respiratory status appears to be at his baseline.    ----------------------------------------- 3:40 AM on 04/27/2020 -----------------------------------------  Documentation delayed due to multiple issues going on simultaneously in the emergency department.  In short, after multiple attempts, a VBG was obtained which shows hypercapnia with a PCO2 of 73.  I looked back through his record and he had a similarly and in fact slightly higher PCO2 about 2 years ago requiring admission.  I talked to the patient and again he endorses no suicidal ideation, he is just frustrated over his chronic medical conditions.  However he does report feeling short of breath.  He says he uses a "machine" to breathe at night.  I talked to him about BiPAP and bring him into the hospital for his acute on chronic respiratory failure with hypercapnia and he agrees that is probably for the best.  I discussed the case by phone with respiratory therapy and he will start him on BiPAP.  Dr. Para March called me back during an intubation of another patient but subsequently I discussed this case with her by phone and she will admit.    .Critical Care Performed by: Loleta Rose, MD Authorized by: Loleta Rose, MD   Critical care provider statement:    Critical care time (minutes):  30   Critical care time was exclusive of:  Separately billable procedures and  treating other patients   Critical care was necessary to treat or prevent imminent or life-threatening deterioration of the following conditions:  Respiratory failure   Critical care was time spent personally by me on the following activities:  Development of treatment plan with patient or surrogate, discussions with consultants, evaluation of patient's response to treatment, examination of patient, obtaining history from patient or surrogate, ordering and performing treatments and interventions, ordering and review of laboratory studies, ordering and review of radiographic studies, pulse oximetry, re-evaluation of patient's condition and review of old charts     Loleta Rose, MD 04/27/20 0341    Loleta Rose, MD 04/27/20 [phone]

## 2020-04-27 DIAGNOSIS — J9612 Chronic respiratory failure with hypercapnia: Secondary | ICD-10-CM | POA: Insufficient documentation

## 2020-04-27 DIAGNOSIS — R0689 Other abnormalities of breathing: Secondary | ICD-10-CM | POA: Diagnosis present

## 2020-04-27 DIAGNOSIS — J9622 Acute and chronic respiratory failure with hypercapnia: Secondary | ICD-10-CM

## 2020-04-27 DIAGNOSIS — E874 Mixed disorder of acid-base balance: Secondary | ICD-10-CM

## 2020-04-27 DIAGNOSIS — J449 Chronic obstructive pulmonary disease, unspecified: Secondary | ICD-10-CM

## 2020-04-27 LAB — BLOOD GAS, ARTERIAL
Acid-Base Excess: 11.7 mmol/L — ABNORMAL HIGH (ref 0.0–2.0)
Bicarbonate: 38.7 mmol/L — ABNORMAL HIGH (ref 20.0–28.0)
Delivery systems: POSITIVE
Expiratory PAP: 5
FIO2: 0.28
Inspiratory PAP: 14
O2 Saturation: 96.3 %
Patient temperature: 37
pCO2 arterial: 64 mmHg — ABNORMAL HIGH (ref 32.0–48.0)
pH, Arterial: 7.39 (ref 7.350–7.450)
pO2, Arterial: 85 mmHg (ref 83.0–108.0)

## 2020-04-27 LAB — BLOOD GAS, VENOUS
Acid-Base Excess: 15.4 mmol/L — ABNORMAL HIGH (ref 0.0–2.0)
Bicarbonate: 43.2 mmol/L — ABNORMAL HIGH (ref 20.0–28.0)
O2 Saturation: 43.8 %
Patient temperature: 37
pCO2, Ven: 73 mmHg (ref 44.0–60.0)
pH, Ven: 7.38 (ref 7.250–7.430)
pO2, Ven: <31 mmHg — CL (ref 32.0–45.0)

## 2020-04-27 LAB — SARS CORONAVIRUS 2 BY RT PCR (HOSPITAL ORDER, PERFORMED IN ~~LOC~~ HOSPITAL LAB): SARS Coronavirus 2: NEGATIVE

## 2020-04-27 MED ORDER — IPRATROPIUM-ALBUTEROL 0.5-2.5 (3) MG/3ML IN SOLN
3.0000 mL | Freq: Once | RESPIRATORY_TRACT | Status: AC
  Administered 2020-04-27: 3 mL via RESPIRATORY_TRACT
  Filled 2020-04-27: qty 3

## 2020-04-27 MED ORDER — BUDESONIDE 0.5 MG/2ML IN SUSP
0.5000 mg | Freq: Two times a day (BID) | RESPIRATORY_TRACT | Status: DC
  Administered 2020-04-27 – 2020-04-28 (×2): 0.5 mg via RESPIRATORY_TRACT
  Filled 2020-04-27 (×4): qty 2

## 2020-04-27 MED ORDER — SIMVASTATIN 10 MG PO TABS
20.0000 mg | ORAL_TABLET | Freq: Every day | ORAL | Status: DC
  Administered 2020-04-28: 20 mg via ORAL
  Filled 2020-04-27: qty 2

## 2020-04-27 MED ORDER — ARFORMOTEROL TARTRATE 15 MCG/2ML IN NEBU
15.0000 ug | INHALATION_SOLUTION | Freq: Two times a day (BID) | RESPIRATORY_TRACT | Status: DC
  Administered 2020-04-27 – 2020-04-28 (×2): 15 ug via RESPIRATORY_TRACT
  Filled 2020-04-27 (×5): qty 2

## 2020-04-27 MED ORDER — METHYLPREDNISOLONE SODIUM SUCC 125 MG IJ SOLR
60.0000 mg | Freq: Once | INTRAMUSCULAR | Status: AC
Start: 2020-04-27 — End: 2020-04-27
  Administered 2020-04-27: 60 mg via INTRAVENOUS
  Filled 2020-04-27: qty 2

## 2020-04-27 MED ORDER — ASPIRIN EC 81 MG PO TBEC
81.0000 mg | DELAYED_RELEASE_TABLET | Freq: Every day | ORAL | Status: DC
  Administered 2020-04-28: 81 mg via ORAL
  Filled 2020-04-27: qty 1

## 2020-04-27 MED ORDER — ENOXAPARIN SODIUM 40 MG/0.4ML ~~LOC~~ SOLN
40.0000 mg | SUBCUTANEOUS | Status: DC
  Administered 2020-04-27: 40 mg via SUBCUTANEOUS
  Filled 2020-04-27: qty 0.4

## 2020-04-27 MED ORDER — IPRATROPIUM-ALBUTEROL 0.5-2.5 (3) MG/3ML IN SOLN
3.0000 mL | Freq: Four times a day (QID) | RESPIRATORY_TRACT | Status: DC | PRN
  Administered 2020-04-28: 3 mL via RESPIRATORY_TRACT
  Filled 2020-04-27: qty 3

## 2020-04-27 NOTE — ED Notes (Signed)
Two unsuccessful attempts at PIV insertion

## 2020-04-27 NOTE — ED Notes (Signed)
Pt soaked in urine- pt cleansed and placed in clean gown, bedding changed, male external cath placed

## 2020-04-27 NOTE — ED Notes (Signed)
Called respiratory to inform them of need for ABG

## 2020-04-27 NOTE — H&P (Signed)
History and Physical    Kyle Frederick JME:[id number] DOB: August 01, 1931 DOA: 04/26/2020  PCP: Gracelyn Nurse, MD   Patient coming from: [facility] I have personally briefly reviewed patient's old medical records in [facility]  Chief Complaint: Shortness of breath, fall  HPI: Kyle Frederick is a 86 y.o. male with medical history significant for COPD, interstitial lung disease with chronic respiratory failure on home O2 at 4 L, followed by pulmonology, hypertension and history of CVA who presented to the emergency room for the second time in 24.  He was first evaluated for fall after trying to get out of a wheelchair.  He had a trauma work-up to include CT head and neck which were negative for acute injury and he was discharged back to [place] [facility].  He returned a few hours later with a concern for suicidal ideation after the nursing home staff saw that he had taken his oxygen off stating that he 'wanted to end it all'.  He was again evaluated and was not found to be suicidal although he admitted to frustration with his chronic comorbidities.  He was not placed IVC.  He was noted to be wheezing.  His blood work notable for bicarb of 43, venous pH of 7.38 and PCO2 of 73, BNP was normal and chest x-ray showed no acute disease.  Covid test was negative.  He was treated with DuoNeb inhalers x3.  Venous blood gas showed pH 7.38 but PCO2 of 73.  EKG showed sinus arrhythmia with first-degree AV block.  Hospitalist consulted for admission  Review of Systems: As per HPI otherwise 10 point review of systems negative though limited   Past Medical History:  Diagnosis Date  . CHF (congestive heart failure) (HCC)   . COPD (chronic obstructive pulmonary disease) (HCC)   . Stroke [facility])     History reviewed. No pertinent surgical history.   reports that he has quit smoking. He has never used smokeless tobacco. He reports that he does not drink alcohol or use drugs.  Allergies  Allergen  Reactions  . Lasix [Furosemide] Other (See Comments)    "syncope"    History reviewed. No pertinent family history.   Prior to Admission medications   Medication Sig Start Date End Date Taking? Authorizing Provider  acetaminophen (TYLENOL) 650 MG CR tablet Take 2 tablets by mouth every 8 (eight) hours as needed.    [provider]  AMBULATORY NON FORMULARY MEDICATION Medication Name: Please dispense nebulizer and supplies. Medications sent to pharmacy. DX J44.9 DME: [facility] 05/05/18   Shane Crutch, MD  AMBULATORY NON FORMULARY MEDICATION Medication Name: Please dispense flutter valve. Use after each nebulizer treatment. DME: [facility] DX: J44.9 05/05/18   Shane Crutch, MD  arformoterol (BROVANA) 15 MCG/2ML NEBU USE 1 VIAL IN NEBULIZER TWO TIMES DAILY. 02/21/19   Shane Crutch, MD  aspirin 81 MG EC tablet Take 1 tablet by mouth daily.     [provider]  budesonide (PULMICORT) 0.5 MG/2ML nebulizer solution USE 1 VIAL IN NEBULIZER TWO TIMES DAILY. 04/06/19   Shane Crutch, MD  cephALEXin (KEFLEX) 500 MG capsule Take 1 capsule (500 mg total) by mouth 3 (three) times daily. 12/20/19   Sharman Cheek, MD  ipratropium-albuterol (DUONEB) 0.5-2.5 (3) MG/3ML SOLN Take 3 mLs by nebulization every 6 (six) hours as needed (sob wheezing). 04/28/18   Adrian Saran, MD  polyethylene glycol (MIRALAX / GLYCOLAX) packet Take 1 packet by mouth daily as needed.    [provider]  simvastatin (ZOCOR) 20 MG tablet Take 1 tablet by mouth daily. 02/09/18   [provider]    Physical Exam: Vitals:   04/27/20 0030 04/27/20 0045 04/27/20 0100 04/27/20 0130  BP: (!) 172/82 (!) 139/51 134/81 103/67  Pulse: 61 (!) 55 66 (!) 51  Resp: 15 15  19   Temp:      TempSrc:      SpO2: 96% 99% 100% 100%  Weight:      Height:         Vitals:   04/27/20 0030 04/27/20 0045 04/27/20 0100 04/27/20 0130  BP: (!) 172/82 (!) 139/51 134/81 103/67  Pulse: 61 (!) 55 66  (!) 51  Resp: 15 15  19   Temp:      TempSrc:      SpO2: 96% 99% 100% 100%  Weight:      Height:        Constitutional: Somnolent but arouses easily, oriented x3, not in any acute distress. Eyes: PERLA, EOMI, irises appear normal, anicteric sclera,  ENMT: external ears and nose appear normal, normal hearing.  BiPAP mask on  neck: neck appears normal, no masses, normal ROM, no thyromegaly, no JVD  CVS: S1-S2 clear, no murmur rubs or gallops,  , no carotid bruits, pedal pulses palpable, 1-2+ pitting edema bilaterally Respiratory: Coarse breath sounds with mild wheezing, no rales or rhonchi. Respiratory effort slightly increased. No accessory muscle use.  Abdomen: soft nontender, nondistended, normal bowel sounds, no hepatosplenomegaly, no hernias Musculoskeletal: : no cyanosis, clubbing , no contractures or atrophy.  Bandage on right lower extremity Neuro: No gross focal neurologic deficit moving all extremities equally Psych: judgement and insight appear normal, stable mood and affect,  Skin: no rashes or lesions or ulcers, no induration or nodules.  Bandage wrap seen on right lower extremity   Labs on Admission: I have personally reviewed following labs and imaging studies  CBC: Recent Labs  Lab 04/26/20 0435 04/26/20 2002  WBC 6.7 8.4  NEUTROABS  --  6.5  HGB 10.5* 10.2*  HCT 35.6* 35.9*  MCV 87.9 88.9  PLT 161 063   Basic Metabolic Panel: Recent Labs  Lab 04/26/20 0435 04/26/20 2002  NA 141 141  K 4.3 4.3  CL 96* 97*  CO2 38* 35*  GLUCOSE 103* 98  BUN 27* 27*  CREATININE 1.14 1.10  CALCIUM 9.3 9.3   GFR: Estimated Creatinine Clearance: 64 mL/min (by C-G formula based on SCr of 1.1 mg/dL). Liver Function Tests: Recent Labs  Lab 04/26/20 0435 04/26/20 2002  AST 18 21  ALT 13 12  ALKPHOS 58 61  BILITOT 0.6 0.6  PROT 6.7 6.9  ALBUMIN 3.6 3.9   No results for input(s): LIPASE, AMYLASE in the last 168 hours. No results for input(s): AMMONIA in the last 168  hours. Coagulation Profile: No results for input(s): INR, PROTIME in the last 168 hours. Cardiac Enzymes: No results for input(s): CKTOTAL, CKMB, CKMBINDEX, TROPONINI in the last 168 hours. BNP (last 3 results) No results for input(s): PROBNP in the last 8760 hours. HbA1C: No results for input(s): HGBA1C in the last 72 hours. CBG: No results for input(s): GLUCAP in the last 168 hours. Lipid Profile: No results for input(s): CHOL, HDL, LDLCALC, TRIG, CHOLHDL, LDLDIRECT in the last 72 hours. Thyroid Function Tests: No results for input(s): TSH, T4TOTAL, FREET4, T3FREE, THYROIDAB in the last 72 hours. Anemia Panel: No results for input(s): VITAMINB12, FOLATE, FERRITIN, TIBC, IRON, RETICCTPCT in the last 72 hours. Urine analysis:  Component Value Date/Time   COLORURINE YELLOW (A) 04/26/2020 0422   APPEARANCEUR HAZY (A) 04/26/2020 0422   LABSPEC 1.013 04/26/2020 0422   PHURINE 6.0 04/26/2020 0422   GLUCOSEU NEGATIVE 04/26/2020 0422   HGBUR LARGE (A) 04/26/2020 0422   BILIRUBINUR NEGATIVE 04/26/2020 0422   KETONESUR NEGATIVE 04/26/2020 0422   PROTEINUR NEGATIVE 04/26/2020 0422   NITRITE NEGATIVE 04/26/2020 0422   LEUKOCYTESUR NEGATIVE 04/26/2020 0422    Radiological Exams on Admission: CT Head Wo Contrast  Result Date: 04/26/2020 CLINICAL DATA:  Fall with head trauma EXAM: CT HEAD WITHOUT CONTRAST CT CERVICAL SPINE WITHOUT CONTRAST TECHNIQUE: Multidetector CT imaging of the head and cervical spine was performed following the standard protocol without intravenous contrast. Multiplanar CT image reconstructions of the cervical spine were also generated. COMPARISON:  12/20/2019 FINDINGS: CT HEAD FINDINGS Brain: No evidence of acute infarction, hemorrhage, hydrocephalus, extra-axial collection or mass lesion/mass effect. Remote left frontal parietal infarcts in a MCA/watershed distribution. Atrophy with ventriculomegaly. Chronic small vessel ischemia in the cerebral white matter. Vascular:  No hyperdense vessel or unexpected calcification. Skull: Negative for fracture. Sinuses/Orbits: Bilateral cataract resection CT CERVICAL SPINE FINDINGS Alignment: Mild scoliosis. Degenerative, mild C3-4 and T1-2 anterolisthesis. Skull base and vertebrae: No acute fracture. No primary bone lesion or focal pathologic process. Soft tissues and spinal canal: No prevertebral fluid or swelling. No visible canal hematoma. Disc levels:  Diffuse degenerative disc narrowing and ridging. Upper chest: No acute finding IMPRESSION: 1. No evidence of acute intracranial or cervical spine injury. 2. Atrophy and remote left cerebral infarcts. Electronically Signed   By: Marnee Spring M.D.   On: 04/26/2020 05:54   CT Cervical Spine Wo Contrast  Result Date: 04/26/2020 CLINICAL DATA:  Fall with head trauma EXAM: CT HEAD WITHOUT CONTRAST CT CERVICAL SPINE WITHOUT CONTRAST TECHNIQUE: Multidetector CT imaging of the head and cervical spine was performed following the standard protocol without intravenous contrast. Multiplanar CT image reconstructions of the cervical spine were also generated. COMPARISON:  12/20/2019 FINDINGS: CT HEAD FINDINGS Brain: No evidence of acute infarction, hemorrhage, hydrocephalus, extra-axial collection or mass lesion/mass effect. Remote left frontal parietal infarcts in a MCA/watershed distribution. Atrophy with ventriculomegaly. Chronic small vessel ischemia in the cerebral white matter. Vascular: No hyperdense vessel or unexpected calcification. Skull: Negative for fracture. Sinuses/Orbits: Bilateral cataract resection CT CERVICAL SPINE FINDINGS Alignment: Mild scoliosis. Degenerative, mild C3-4 and T1-2 anterolisthesis. Skull base and vertebrae: No acute fracture. No primary bone lesion or focal pathologic process. Soft tissues and spinal canal: No prevertebral fluid or swelling. No visible canal hematoma. Disc levels:  Diffuse degenerative disc narrowing and ridging. Upper chest: No acute finding  IMPRESSION: 1. No evidence of acute intracranial or cervical spine injury. 2. Atrophy and remote left cerebral infarcts. Electronically Signed   By: Marnee Spring M.D.   On: 04/26/2020 05:54   DG Chest Portable 1 View  Result Date: 04/26/2020 CLINICAL DATA:  Shortness of breath EXAM: PORTABLE CHEST 1 VIEW COMPARISON:  12/20/2019, 04/25/2018. FINDINGS: Cardiomegaly. Mild diffuse bilateral interstitial opacity. No large pleural effusion or pneumothorax. IMPRESSION: Cardiomegaly with mild diffuse interstitial opacity which may be secondary to mild edema or atypical respiratory infection. Electronically Signed   By: Jasmine Pang M.D.   On: 04/26/2020 21:31    EKG: Independently reviewed.   Assessment/Plan Principal Problem:   Acute on chronic respiratory failure with hypercapnia   Metabolic alkalosis with respiratory acidosis   Interstitial lung disease (HCC) with possible mild flare   COPD with mild acute bronchitis (HCC) -  Patient with history of ILD and COPD on home O2 at 4 L presenting following a fall with work-up showing venous pH 7.38 with venous PCO2 of 73 and serum bicarb critically elevated at 43 -Covid test was negative.  BNP was normal at 52 -Continue BiPAP and wean as tolerated O2 via nasal cannula -Treated with duo nebs x3 in the emergency room with improvement -Continue home bronchodilator treatments -DuoNebs as needed -Chest x-ray showed cardiomegaly, mild diffuse interstitial opacity which may be secondary to mild edema or atypical respiratory infection  Possible acute metabolic encephalopathy secondary to CO2 narcosis -Patient had a fall earlier in the day at his residence and was also seen taking oxygen off -In retrospect, patient may have been confused given critically elevated bicarb and venous PCO2, low baseline PCO2 uncertain -Overnight monitoring    CVA, old, aphasia -No acute concerns.  CT head showed no acute abnormality -Continue home aspirin and  simvastatin  Fall, subsequent encounter -No acute concerns -Patient was seen earlier in the day following a fall out of his wheelchair -No acute injury.  Head CT and C-spine were negative -HTN (hypertension)  Possible depression related to general medical condition -Patient was sent for evaluation after he he was found without his oxygen stating he did not want to continue going on -Patient denied suicidal ideation both the ER provider and myself -Might benefit from psychiatric evaluation in the a.m.    DVT prophylaxis: Lovenox  Code Status: full code  Family Communication:  none  Disposition Plan: Back to previous home environment Consults called: none  Status:obs    Andris Baumann MD [facility]     04/27/2020, 3:18 AM

## 2020-04-27 NOTE — Progress Notes (Signed)
PROGRESS NOTE    Kyle Frederick  FUX:[id number] DOB: 1931-01-04 DOA: 04/26/2020 PCP: Gracelyn Nurse, MD    Assessment & Plan:   Principal Problem:   Metabolic alkalosis with respiratory acidosis Active Problems:   CVA, old, aphasia   HTN (hypertension)   Interstitial lung disease (HCC)   COPD with chronic bronchitis (HCC)   Acute on chronic respiratory failure with hypercapnia (HCC)   Hypercapnemia    Kyle Frederick is a 86 y.o. male with medical history significant for COPD, interstitial lung disease with chronic respiratory failure on home O2 at 4 L, followed by pulmonology, hypertension and history of CVA who presented to the emergency room for the second time in 24.  He was first evaluated for fall after trying to get out of a wheelchair.  He had a trauma work-up to include CT head and neck which were negative for acute injury and he was discharged back to [facility].  He returned a few hours later with a concern for suicidal ideation after the nursing home staff saw that he had taken his oxygen off stating that he 'wanted to end it all'.  He was again evaluated and was not found to be suicidal although he admitted to frustration with his chronic comorbidities.     Acute on chronic respiratory failure with hypercapnia Chronic hypoxic respiratory failure on 4L baseline Chronic Interstitial lung disease  COPD  -Presented following a fall with work-up showing venous pH 7.38 with venous PCO2 of 73  -Covid test was negative.  BNP was normal at 52 --placed on BiPAP and wean down to O2 via nasal cannula PLAN: -Continue home bronchodilator treatments -DuoNebs as needed    CVA, old, aphasia -No acute concerns.  CT head showed no acute abnormality -Continue home aspirin and simvastatin  Fall, subsequent encounter -No acute concerns -Patient was seen earlier in the day following a fall out of his wheelchair -No acute injury.  Head CT and C-spine were  negative  Possible depression related to general medical condition -Patient was sent for evaluation after he he was found without his oxygen stating he did not want to continue going on -Patient denied suicidal ideation both the ER provider and myself -Palliative consult for goals of care   DVT prophylaxis: Lovenox SQ Code Status: Full code  Family Communication:  Status is: inpatient Dispo:   The patient is from: [facility] Anticipated d/c is to: [place] Anticipated d/c date is: 1-2 days Patient currently is not medically stable to d/c due to: improvement in respiratory status   Subjective and Interval History:  Pt admitted to dyspnea.  No pain.  No eating drinking well, but having urine output and BM's.  No fever.     Objective: Vitals:   04/27/20 1615 04/27/20 1630 04/27/20 1730 04/27/20 1900  BP: (!) 179/63 (!) 168/64 (!) 157/67 (!) 145/81  Pulse: (!) 51  61 71  Resp: (!) 23 20    Temp:      TempSrc:      SpO2: 97%  97% 95%  Weight:      Height:       No intake or output data in the 24 hours ending 04/27/20 1937 Filed Weights   04/26/20 1729  Weight: 114.6 kg    Examination:   Constitutional: NAD, AAOx3 HEENT: conjunctivae and lids normal, EOMI CV: RRR no M,R,G. Distal pulses +2.  No cyanosis.   RESP: on BiPAP GI: +BS, large abdomen, NT Extremities: 1-2+ pitting  edema in BLE SKIN: warm.  Erythematous rash in skin folds Neuro: II - XII grossly intact.  Sensation intact Psych: Depressed mood and affect.     Data Reviewed: I have personally reviewed following labs and imaging studies  CBC: Recent Labs  Lab 04/26/20 0435 04/26/20 2002  WBC 6.7 8.4  NEUTROABS  --  6.5  HGB 10.5* 10.2*  HCT 35.6* 35.9*  MCV 87.9 88.9  PLT 161 196   Basic Metabolic Panel: Recent Labs  Lab 04/26/20 0435 04/26/20 2002  NA 141 141  K 4.3 4.3  CL 96* 97*  CO2 38* 35*  GLUCOSE 103* 98  BUN 27* 27*  CREATININE 1.14 1.10  CALCIUM 9.3 9.3    GFR: Estimated Creatinine Clearance: 64 mL/min (by C-G formula based on SCr of 1.1 mg/dL). Liver Function Tests: Recent Labs  Lab 04/26/20 0435 04/26/20 2002  AST 18 21  ALT 13 12  ALKPHOS 58 61  BILITOT 0.6 0.6  PROT 6.7 6.9  ALBUMIN 3.6 3.9   No results for input(s): LIPASE, AMYLASE in the last 168 hours. No results for input(s): AMMONIA in the last 168 hours. Coagulation Profile: No results for input(s): INR, PROTIME in the last 168 hours. Cardiac Enzymes: No results for input(s): CKTOTAL, CKMB, CKMBINDEX, TROPONINI in the last 168 hours. BNP (last 3 results) No results for input(s): PROBNP in the last 8760 hours. HbA1C: No results for input(s): HGBA1C in the last 72 hours. CBG: No results for input(s): GLUCAP in the last 168 hours. Lipid Profile: No results for input(s): CHOL, HDL, LDLCALC, TRIG, CHOLHDL, LDLDIRECT in the last 72 hours. Thyroid Function Tests: No results for input(s): TSH, T4TOTAL, FREET4, T3FREE, THYROIDAB in the last 72 hours. Anemia Panel: No results for input(s): VITAMINB12, FOLATE, FERRITIN, TIBC, IRON, RETICCTPCT in the last 72 hours. Sepsis Labs: No results for input(s): PROCALCITON, LATICACIDVEN in the last 168 hours.  Recent Results (from the past 240 hour(s))  SARS Coronavirus 2 by RT PCR (hospital order, performed in [facility] hospital lab) Nasopharyngeal Nasopharyngeal Swab     Status: None   Collection Time: 04/26/20 11:30 PM   Specimen: Nasopharyngeal Swab  Result Value Ref Range Status   SARS Coronavirus 2 NEGATIVE NEGATIVE Final    Comment: (NOTE) SARS-CoV-2 target nucleic acids are NOT DETECTED. The SARS-CoV-2 RNA is generally detectable in upper and lower respiratory specimens during the acute phase of infection. The lowest concentration of SARS-CoV-2 viral copies this assay can detect is 250 copies / mL. A negative result does not preclude SARS-CoV-2 infection and should not be used as the sole basis for treatment or  other patient management decisions.  A negative result may occur with improper specimen collection / handling, submission of specimen other than nasopharyngeal swab, presence of viral mutation(s) within the areas targeted by this assay, and inadequate number of viral copies (<250 copies / mL). A negative result must be combined with clinical observations, patient history, and epidemiological information. Fact Sheet for Patients:   StrictlyIdeas.no Fact Sheet for Healthcare Providers: BankingDealers.co.za This test is not yet approved or cleared  by the [place] FDA and has been authorized for detection and/or diagnosis of SARS-CoV-2 by FDA under an Emergency Use Authorization (EUA).  This EUA will remain in effect (meaning this test can be used) for the duration of the COVID-19 declaration under Section 564(b)(1) of the Act, 21 U.S.C. section 360bbb-3(b)(1), unless the authorization is terminated or revoked sooner. Performed at [facility], [place],  [place], [place] [address]       Radiology Studies: CT Head Wo Contrast  Result Date: 04/26/2020 CLINICAL DATA:  Fall with head trauma EXAM: CT HEAD WITHOUT CONTRAST CT CERVICAL SPINE WITHOUT CONTRAST TECHNIQUE: Multidetector CT imaging of the head and cervical spine was performed following the standard protocol without intravenous contrast. Multiplanar CT image reconstructions of the cervical spine were also generated. COMPARISON:  12/20/2019 FINDINGS: CT HEAD FINDINGS Brain: No evidence of acute infarction, hemorrhage, hydrocephalus, extra-axial collection or mass lesion/mass effect. Remote left frontal parietal infarcts in a MCA/watershed distribution. Atrophy with ventriculomegaly. Chronic small vessel ischemia in the cerebral white matter. Vascular: No hyperdense vessel or unexpected calcification. Skull: Negative for fracture. Sinuses/Orbits: Bilateral cataract resection  CT CERVICAL SPINE FINDINGS Alignment: Mild scoliosis. Degenerative, mild C3-4 and T1-2 anterolisthesis. Skull base and vertebrae: No acute fracture. No primary bone lesion or focal pathologic process. Soft tissues and spinal canal: No prevertebral fluid or swelling. No visible canal hematoma. Disc levels:  Diffuse degenerative disc narrowing and ridging. Upper chest: No acute finding IMPRESSION: 1. No evidence of acute intracranial or cervical spine injury. 2. Atrophy and remote left cerebral infarcts. Electronically Signed   By: Marnee Spring M.D.   On: 04/26/2020 05:54   CT Cervical Spine Wo Contrast  Result Date: 04/26/2020 CLINICAL DATA:  Fall with head trauma EXAM: CT HEAD WITHOUT CONTRAST CT CERVICAL SPINE WITHOUT CONTRAST TECHNIQUE: Multidetector CT imaging of the head and cervical spine was performed following the standard protocol without intravenous contrast. Multiplanar CT image reconstructions of the cervical spine were also generated. COMPARISON:  12/20/2019 FINDINGS: CT HEAD FINDINGS Brain: No evidence of acute infarction, hemorrhage, hydrocephalus, extra-axial collection or mass lesion/mass effect. Remote left frontal parietal infarcts in a MCA/watershed distribution. Atrophy with ventriculomegaly. Chronic small vessel ischemia in the cerebral white matter. Vascular: No hyperdense vessel or unexpected calcification. Skull: Negative for fracture. Sinuses/Orbits: Bilateral cataract resection CT CERVICAL SPINE FINDINGS Alignment: Mild scoliosis. Degenerative, mild C3-4 and T1-2 anterolisthesis. Skull base and vertebrae: No acute fracture. No primary bone lesion or focal pathologic process. Soft tissues and spinal canal: No prevertebral fluid or swelling. No visible canal hematoma. Disc levels:  Diffuse degenerative disc narrowing and ridging. Upper chest: No acute finding IMPRESSION: 1. No evidence of acute intracranial or cervical spine injury. 2. Atrophy and remote left cerebral infarcts.  Electronically Signed   By: Marnee Spring M.D.   On: 04/26/2020 05:54   DG Chest Portable 1 View  Result Date: 04/26/2020 CLINICAL DATA:  Shortness of breath EXAM: PORTABLE CHEST 1 VIEW COMPARISON:  12/20/2019, 04/25/2018. FINDINGS: Cardiomegaly. Mild diffuse bilateral interstitial opacity. No large pleural effusion or pneumothorax. IMPRESSION: Cardiomegaly with mild diffuse interstitial opacity which may be secondary to mild edema or atypical respiratory infection. Electronically Signed   By: Jasmine Pang M.D.   On: 04/26/2020 21:31     Scheduled Meds: . arformoterol  15 mcg Nebulization BID  . [START ON 04/28/2020] aspirin EC  81 mg Oral Daily  . budesonide  0.5 mg Nebulization BID  . enoxaparin (LOVENOX) injection  40 mg Subcutaneous Q24H  . [START ON 04/28/2020] simvastatin  20 mg Oral Daily   Continuous Infusions:   LOS: 0 days     Darlin Priestly, MD [facility] If 7PM-7AM, please contact night-coverage 04/27/2020, 7:37 PM

## 2020-04-27 NOTE — ED Notes (Signed)
Pt states coming in for oxygen and denies SI. Pt states he is on oxygen at home at baseline.

## 2020-04-27 NOTE — ED Notes (Signed)
Called and updated patient's family (daughter) that patient is being admitted to the hospital and on current plan of care.

## 2020-04-27 NOTE — ED Notes (Signed)
Pt alert with bipap in place and denies needs at this time

## 2020-04-27 NOTE — ED Notes (Signed)
Pt continues to pick at and remove monitoring equipment- bp cuff and O2 monitoring placed back on pt

## 2020-04-28 DIAGNOSIS — E874 Mixed disorder of acid-base balance: Secondary | ICD-10-CM | POA: Diagnosis not present

## 2020-04-28 LAB — BASIC METABOLIC PANEL
Anion gap: 9 (ref 5–15)
BUN: 30 mg/dL — ABNORMAL HIGH (ref 8–23)
CO2: 35 mmol/L — ABNORMAL HIGH (ref 22–32)
Calcium: 9.2 mg/dL (ref 8.9–10.3)
Chloride: 95 mmol/L — ABNORMAL LOW (ref 98–111)
Creatinine, Ser: 1.25 mg/dL — ABNORMAL HIGH (ref 0.61–1.24)
GFR calc Af Amer: >60 mL/min (ref >60)
GFR calc non Af Amer: 52 mL/min — ABNORMAL LOW (ref >60)
Glucose, Bld: 113 mg/dL — ABNORMAL HIGH (ref 70–99)
Potassium: 4.1 mmol/L (ref 3.5–5.1)
Sodium: 139 mmol/L (ref 135–145)

## 2020-04-28 LAB — CBC
HCT: 33.7 % — ABNORMAL LOW (ref 39.0–52.0)
Hemoglobin: 9.7 g/dL — ABNORMAL LOW (ref 13.0–17.0)
MCH: 25.1 pg — ABNORMAL LOW (ref 26.0–34.0)
MCHC: 28.8 g/dL — ABNORMAL LOW (ref 30.0–36.0)
MCV: 87.3 fL (ref 80.0–100.0)
Platelets: 195 10*3/uL (ref 150–400)
RBC: 3.86 MIL/uL — ABNORMAL LOW (ref 4.22–5.81)
RDW: 14 % (ref 11.5–15.5)
WBC: 9.7 10*3/uL (ref 4.0–10.5)
nRBC: 0 % (ref 0.0–0.2)

## 2020-04-28 LAB — MAGNESIUM: Magnesium: 2 mg/dL (ref 1.7–2.4)

## 2020-04-28 NOTE — ED Notes (Signed)
Pt provided w/ lunch tray by Ronnie.

## 2020-04-28 NOTE — ED Notes (Signed)
Provider notified of even, please see 02:55 note. Provider stated no further intervention. Pt states he is feeling "much better."

## 2020-04-28 NOTE — ED Notes (Signed)
Pt repositioned w/ assistance from ED tech Zach. Pt becoming agitated w/ waiting for EMS transport. Pt has asked on two separate occasions "where is the exit around here". Apologized to pt and thanked him for being patient.

## 2020-04-28 NOTE — ED Notes (Signed)
DC instructions gone over w/ pt's Step daughter/POA Jolaine Artist. Topaz signature collector inop at bedside.

## 2020-04-28 NOTE — ED Notes (Signed)
Provider notified of blood pressure of 133/50

## 2020-04-28 NOTE — ED Notes (Signed)
Pt provided breakfast tray.

## 2020-04-28 NOTE — ED Notes (Addendum)
[place], NP notified of pts diastolic BP of less than 60 at 2330. Provider notified cuff was adjusted and got a diastolic BP of over 60.

## 2020-04-28 NOTE — ED Notes (Signed)
Provider acknowledged bp of 140/55 and heart rate of 45. Provider gave no new orders. Pt on cardiac, bp and pulse ox monitoring.

## 2020-04-28 NOTE — ED Notes (Addendum)
RN was walking by room and pt was attempting to get out of bed. RN helped pt back into bed and noted bed was soiled with urine and external urine device was off. RN with another RN helped clean pt and put new bedding on. Pts external urine catching device replaced and hooked to suction. Pt noted to be wheezing and diaphoretic.  RN gave PRN DuoNeb, please see MAR. Pt situated in bed. Pt HOB 30 degrees.  Provider notified of event and vitals provided

## 2020-04-28 NOTE — TOC Transition Note (Addendum)
Transition of Care [facility]) - CM/SW Discharge Note   Patient Details  Name: BENZION MESTA MRN: [id number] Date of Birth: 1931-05-14  [facility] CM/SW Contact:  Larwance Rote, LCSW Phone Number: 04/28/2020, 11:04 AM   Clinical Narrative:   Patient from [facility]  CSW received chat message regarding patient need for palliative care.  [id number]   CSW called the patient's daughter, Jolaine Artist, [phone]. CSW informed Mrs. Artis Flock that a request has been placed for home Hospice with [facility]. It was explained that due to the long holiday weekend services might be delayed.    CSW called [facility] after hours hospital liaison.  Waiting for call back.   CSW called patient's cell.  Not able to leave message.  CSW will attempt to call family.     Larwance Rote, MSW, LCSW  [phone] 8am-6pm (weekends)

## 2020-04-28 NOTE — Discharge Summary (Signed)
Physician Discharge Summary   Kyle Frederick  male DOB: 1931/01/24  TMH:[id number]  PCP: Baxter Hire, MD  Admit date: 04/26/2020 Discharge date: 04/28/2020  Admitted From: [facility] Disposition:  [place]: Full code   Hospital Course:  For full details, please see H&P, progress notes, consult notes and ancillary notes.  Briefly,  Kyle Frederick a 86 y.o. Caucasian malewith medical history significant forCOPD, interstitial lung disease with chronic respiratory failure on home O2 at 4 L, followed by pulmonology, hypertension and history of CVA who presented to the emergency room for the second time in 24 hours. He was first evaluated for fall after trying to get out of a wheelchair. He had a trauma work-up to include CT head and neck which were negative for acute injury and he was discharged back to [facility]. He returned a few hours later with a concern for suicidal ideation after the nursing home staff saw that he had taken his oxygen off stating that he 'wanted to end it all'. He was again evaluated and was not found to be suicidal although he admitted to frustration with his chronic comorbidities.    # Acute on chronic respiratory failure with hypercapnia # Chronic hypoxic respiratory failure on 4L baseline # ChronicInterstitial lung disease  # COPD, not in exacerbation Presented following a fall with work-up showing venous pH 7.38 with venous PCO2 of 73.  Hypercapnia likely chronic.  Pt received brief period of BiPAP and was quickly weaned down to 2L [place].  Covid test was negative. BNP was normal at 52.  Continued home bronchodilator treatments.  Prior to discharge, pt reported his breathing was baseline.  CVA, old, aphasia No acute concerns. CT head showed no acute abnormality.  Continued home aspirin and simvastatin.  Fall, subsequent encounter No acute concerns.  Patient was seen earlier in the ED following a fall out of his  wheelchair.  No acute injury. Head CT and C-spine were negative.  Pt denied pain.  Possible depression related to general medical condition Patient was sent for evaluation after he he was found without his oxygen stating he did not want to continue going on.  Patient denied suicidal ideation both the ER provider and myself.  Given his multiple serious medical conditions and advanced age, it is reasonable to consider palliative measures.  Palliative consult requested, however, would not be available during the long weekend, therefore, pt was discharged with outpatient palliative referral.     Discharge Diagnoses:  Principal Problem:   Metabolic alkalosis with respiratory acidosis Active Problems:   CVA, old, aphasia   HTN (hypertension)   Interstitial lung disease (HCC)   COPD with chronic bronchitis (HCC)   Acute on chronic respiratory failure with hypercapnia ([place])   Hypercapnemia    Discharge Instructions:  Allergies as of 04/28/2020      Reactions   Lasix [furosemide] Other (See Comments)   "syncope"      Medication List    STOP taking these medications   cephALEXin 500 MG capsule Commonly known as: KEFLEX     TAKE these medications   acetaminophen 650 MG CR tablet Commonly known as: TYLENOL Take 2 tablets by mouth every 8 (eight) hours as needed.   AMBULATORY NON FORMULARY MEDICATION Medication Name: Please dispense nebulizer and supplies. Medications sent to pharmacy. DX J44.9 DME: [facility]   AMBULATORY NON FORMULARY MEDICATION Medication Name: Please dispense flutter valve. Use after each nebulizer treatment. DME: [facility] DX: J44.9   arformoterol  15 MCG/2ML Nebu Commonly known as: Brovana USE 1 VIAL IN NEBULIZER TWO TIMES DAILY.   aspirin 81 MG EC tablet Take 1 tablet by mouth daily.   budesonide 0.5 MG/2ML nebulizer solution Commonly known as: PULMICORT USE 1 VIAL IN NEBULIZER TWO TIMES DAILY.   ipratropium-albuterol 0.5-2.5 (3) MG/3ML Soln Commonly known  as: DUONEB Take 3 mLs by nebulization every 6 (six) hours as needed (sob wheezing).   polyethylene glycol 17 g packet Commonly known as: MIRALAX / GLYCOLAX Take 1 packet by mouth daily as needed.   simvastatin 20 MG tablet Commonly known as: ZOCOR Take 1 tablet by mouth daily.       Follow-up Information    Gracelyn Nurse, MD. Schedule an appointment as soon as possible for a visit in 1 week(s).   Specialty: Internal Medicine Contact information: [address] [place] [address] [phone]           Allergies  Allergen Reactions  . Lasix [Furosemide] Other (See Comments)    "syncope"     The results of significant diagnostics from this hospitalization (including imaging, microbiology, ancillary and laboratory) are listed below for reference.   Consultations:   Procedures/Studies: CT Head Wo Contrast  Result Date: 04/26/2020 CLINICAL DATA:  Fall with head trauma EXAM: CT HEAD WITHOUT CONTRAST CT CERVICAL SPINE WITHOUT CONTRAST TECHNIQUE: Multidetector CT imaging of the head and cervical spine was performed following the standard protocol without intravenous contrast. Multiplanar CT image reconstructions of the cervical spine were also generated. COMPARISON:  12/20/2019 FINDINGS: CT HEAD FINDINGS Brain: No evidence of acute infarction, hemorrhage, hydrocephalus, extra-axial collection or mass lesion/mass effect. Remote left frontal parietal infarcts in a MCA/watershed distribution. Atrophy with ventriculomegaly. Chronic small vessel ischemia in the cerebral white matter. Vascular: No hyperdense vessel or unexpected calcification. Skull: Negative for fracture. Sinuses/Orbits: Bilateral cataract resection CT CERVICAL SPINE FINDINGS Alignment: Mild scoliosis. Degenerative, mild C3-4 and T1-2 anterolisthesis. Skull base and vertebrae: No acute fracture. No primary bone lesion or focal pathologic process. Soft tissues and spinal canal: No prevertebral fluid or  swelling. No visible canal hematoma. Disc levels:  Diffuse degenerative disc narrowing and ridging. Upper chest: No acute finding IMPRESSION: 1. No evidence of acute intracranial or cervical spine injury. 2. Atrophy and remote left cerebral infarcts. Electronically Signed   By: Marnee Spring M.D.   On: 04/26/2020 05:54   CT Cervical Spine Wo Contrast  Result Date: 04/26/2020 CLINICAL DATA:  Fall with head trauma EXAM: CT HEAD WITHOUT CONTRAST CT CERVICAL SPINE WITHOUT CONTRAST TECHNIQUE: Multidetector CT imaging of the head and cervical spine was performed following the standard protocol without intravenous contrast. Multiplanar CT image reconstructions of the cervical spine were also generated. COMPARISON:  12/20/2019 FINDINGS: CT HEAD FINDINGS Brain: No evidence of acute infarction, hemorrhage, hydrocephalus, extra-axial collection or mass lesion/mass effect. Remote left frontal parietal infarcts in a MCA/watershed distribution. Atrophy with ventriculomegaly. Chronic small vessel ischemia in the cerebral white matter. Vascular: No hyperdense vessel or unexpected calcification. Skull: Negative for fracture. Sinuses/Orbits: Bilateral cataract resection CT CERVICAL SPINE FINDINGS Alignment: Mild scoliosis. Degenerative, mild C3-4 and T1-2 anterolisthesis. Skull base and vertebrae: No acute fracture. No primary bone lesion or focal pathologic process. Soft tissues and spinal canal: No prevertebral fluid or swelling. No visible canal hematoma. Disc levels:  Diffuse degenerative disc narrowing and ridging. Upper chest: No acute finding IMPRESSION: 1. No evidence of acute intracranial or cervical spine injury. 2. Atrophy and remote left cerebral infarcts. Electronically Signed  By: Marnee Spring M.D.   On: 04/26/2020 05:54   DG Chest Portable 1 View  Result Date: 04/26/2020 CLINICAL DATA:  Shortness of breath EXAM: PORTABLE CHEST 1 VIEW COMPARISON:  12/20/2019, 04/25/2018. FINDINGS: Cardiomegaly. Mild  diffuse bilateral interstitial opacity. No large pleural effusion or pneumothorax. IMPRESSION: Cardiomegaly with mild diffuse interstitial opacity which may be secondary to mild edema or atypical respiratory infection. Electronically Signed   By: Jasmine Pang M.D.   On: 04/26/2020 21:31      Labs: BNP (last 3 results) Recent Labs    04/26/20 2053  BNP 52.4   Basic Metabolic Panel: Recent Labs  Lab 04/26/20 0435 04/26/20 2002 04/28/20 0400  NA 141 141 139  K 4.3 4.3 4.1  CL 96* 97* 95*  CO2 38* 35* 35*  GLUCOSE 103* 98 113*  BUN 27* 27* 30*  CREATININE 1.14 1.10 1.25*  CALCIUM 9.3 9.3 9.2  MG  --   --  2.0   Liver Function Tests: Recent Labs  Lab 04/26/20 0435 04/26/20 2002  AST 18 21  ALT 13 12  ALKPHOS 58 61  BILITOT 0.6 0.6  PROT 6.7 6.9  ALBUMIN 3.6 3.9   No results for input(s): LIPASE, AMYLASE in the last 168 hours. No results for input(s): AMMONIA in the last 168 hours. CBC: Recent Labs  Lab 04/26/20 0435 04/26/20 2002 04/28/20 0400  WBC 6.7 8.4 9.7  NEUTROABS  --  6.5  --   HGB 10.5* 10.2* 9.7*  HCT 35.6* 35.9* 33.7*  MCV 87.9 88.9 87.3  PLT 161 194 195   Cardiac Enzymes: No results for input(s): CKTOTAL, CKMB, CKMBINDEX, TROPONINI in the last 168 hours. BNP: Invalid input(s): POCBNP CBG: No results for input(s): GLUCAP in the last 168 hours. D-Dimer No results for input(s): DDIMER in the last 72 hours. Hgb A1c No results for input(s): HGBA1C in the last 72 hours. Lipid Profile No results for input(s): CHOL, HDL, LDLCALC, TRIG, CHOLHDL, LDLDIRECT in the last 72 hours. Thyroid function studies No results for input(s): TSH, T4TOTAL, T3FREE, THYROIDAB in the last 72 hours.  Invalid input(s): FREET3 Anemia work up No results for input(s): VITAMINB12, FOLATE, FERRITIN, TIBC, IRON, RETICCTPCT in the last 72 hours. Urinalysis    Component Value Date/Time   COLORURINE YELLOW (A) 04/26/2020 0422   APPEARANCEUR HAZY (A) 04/26/2020 0422    LABSPEC 1.013 04/26/2020 0422   PHURINE 6.0 04/26/2020 0422   GLUCOSEU NEGATIVE 04/26/2020 0422   HGBUR LARGE (A) 04/26/2020 0422   BILIRUBINUR NEGATIVE 04/26/2020 0422   KETONESUR NEGATIVE 04/26/2020 0422   PROTEINUR NEGATIVE 04/26/2020 0422   NITRITE NEGATIVE 04/26/2020 0422   LEUKOCYTESUR NEGATIVE 04/26/2020 0422   Sepsis Labs Invalid input(s): PROCALCITONIN,  WBC,  LACTICIDVEN Microbiology Recent Results (from the past 240 hour(s))  SARS Coronavirus 2 by RT PCR (hospital order, performed in [facility] hospital lab) Nasopharyngeal Nasopharyngeal Swab     Status: None   Collection Time: 04/26/20 11:30 PM   Specimen: Nasopharyngeal Swab  Result Value Ref Range Status   SARS Coronavirus 2 NEGATIVE NEGATIVE Final    Comment: (NOTE) SARS-CoV-2 target nucleic acids are NOT DETECTED. The SARS-CoV-2 RNA is generally detectable in upper and lower respiratory specimens during the acute phase of infection. The lowest concentration of SARS-CoV-2 viral copies this assay can detect is 250 copies / mL. A negative result does not preclude SARS-CoV-2 infection and should not be used as the sole basis for treatment or other patient management decisions.  A negative  result may occur with improper specimen collection / handling, submission of specimen other than nasopharyngeal swab, presence of viral mutation(s) within the areas targeted by this assay, and inadequate number of viral copies (<250 copies / mL). A negative result must be combined with clinical observations, patient history, and epidemiological information. Fact Sheet for Patients:   BoilerBrush.com.cy Fact Sheet for Healthcare Providers: https://pope.com/ This test is not yet approved or cleared  by the [place] FDA and has been authorized for detection and/or diagnosis of SARS-CoV-2 by FDA under an Emergency Use Authorization (EUA).  This EUA will remain in effect (meaning this  test can be used) for the duration of the COVID-19 declaration under Section 564(b)(1) of the Act, 21 U.S.C. section 360bbb-3(b)(1), unless the authorization is terminated or revoked sooner. Performed at [facility], [address] [place], [place] [address]      Total time spend on discharging this patient, including the last patient exam, discussing the hospital stay, instructions for ongoing care as it relates to all pertinent caregivers, as well as preparing the medical discharge records, prescriptions, and/or referrals as applicable, is 35 minutes.    Darlin Priestly, MD  [facility] 04/28/2020, 10:51 AM  If 7PM-7AM, please contact night-coverage

## 2020-04-28 NOTE — ED Notes (Signed)
Family updated as to patient's status.spoke w/ pt's step daughter Eunice Blase.

## 2020-04-28 NOTE — ED Notes (Signed)
Provider notified of BP of 140/55. MAP of 81 and a heart rate of 45bpm.

## 2020-05-16 ENCOUNTER — Other Ambulatory Visit: Payer: Self-pay

## 2020-05-16 ENCOUNTER — Non-Acute Institutional Stay: Payer: Medicare Other | Admitting: Adult Health Nurse Practitioner

## 2020-05-16 DIAGNOSIS — I509 Heart failure, unspecified: Secondary | ICD-10-CM

## 2020-05-16 DIAGNOSIS — Z515 Encounter for palliative care: Secondary | ICD-10-CM

## 2020-05-16 NOTE — Progress Notes (Signed)
Therapist, nutritional Palliative Care Consult Note Telephone: [phone]  Fax: [phone]  PATIENT NAME: Kyle Frederick DOB: 07/15/1931 MRN: [id number]  PRIMARY CARE PROVIDER:   Gracelyn Nurse, MD  REFERRING PROVIDER:  Gracelyn Nurse, MD [address] [place],  [place] [address]  RESPONSIBLE PARTY:   Kyle Frederick, daughter H: [phone]  C: [phone]     RECOMMENDATIONS and PLAN:  1.  Advanced care planning.  Patient asked me to speak with his daughter about ACP.  Daughter states that he does have a DNR and living will at the facility.  She also states that he does not want feeding tube.  2.  Functional status.  Patient is able to self propel in wheelchair.  He is unable to walk.  Requires assistance with ADLs and transfers to go to the bathroom.  Patient has had a couple falls this year.  First fall was 12/20/2019 and he was evaluated in the ER was found to have hematuria and was started on Keflex at that time.  On 04/26/2020 he had another fall.  Later that same day he was admitted to the hospital for CHF exacerbation.  Patient is able to feed himself.  Does state that he is trying to lose weight and is trying to cut back on his portions.  Currently weighs 250 pounds.  Patient is working with physical therapy at this time.  Continue PT as ordered and supportive care and fall precautions at facility.  3.  CHF/COPD.  Patient is O2 dependent at 2 L continuous.  Patient has wraps to bilateral lower extremities up to knees.  Has stable DOE.  Does state having occasional cough.  Does state having some chest pain with coughing.  Patient states that he is unable to lay on his back or his left side so he sleeps laying on his right side.  Does prop up with 2 pillows when laying in bed.  Does not have to bump up his oxygen with activity.  Denies headaches, dizziness, fever, pain, N/V/D.  When hospitalized on 04/26/2020 daughter does state that there is concerns for  SI.  She states that he had taken his oxygen off and that he usually never does that.  States that when he was asked if he was trying to kill himself he said yes.  Patient is hard of hearing and may not have understood the question.  Patient denies depression, anxiety, thoughts of harming self.  Continue supportive care at facility.  Palliative will continue to monitor for symptom management/decline and make recommendations as needed.  We will follow up in 6 to 8 weeks.  I spent 50 minutes providing this consultation,  from 9:30 to 10:20 including time spent with patient/family, chart review, provider coordination, documentation. More than 50% of the time in this consultation was spent coordinating communication.   HISTORY OF PRESENT ILLNESS:  Kyle Frederick is a 86 y.o. year old male with multiple medical problems including CHF, COPD, history of CVA, HTN, anemia, depression. Palliative Care was asked to help address goals of care.   CODE STATUS: DNR  PPS: 30% HOSPICE ELIGIBILITY/DIAGNOSIS: TBD  PHYSICAL EXAM:   General: NAD, frail appearing Cardiovascular: regular rate and rhythm Pulmonary: Lung sounds clear; normal respiratory effort Abdomen: soft, nontender, + bowel sounds GU: no suprapubic tenderness Extremities: Patient has wraps to bilateral lower extremities with some nonpitting edema noted below the wraps on his feet, no joint deformities Skin: no rashes on exposed skin Neurological:  Weakness but otherwise nonfocal; alert and oriented x3 though has forgetfulness and is not good with timelines.   PAST MEDICAL HISTORY:  Past Medical History:  Diagnosis Date  . CHF (congestive heart failure) ([place])   . COPD (chronic obstructive pulmonary disease) ([place])   . Stroke [facility])     SOCIAL HX:  Social History   Tobacco Use  . Smoking status: Former Research scientist (life sciences)  . Smokeless tobacco: Never Used  Substance Use Topics  . Alcohol use: Never    ALLERGIES:  Allergies  Allergen Reactions    . Lasix [Furosemide] Other (See Comments)    "syncope"     PERTINENT MEDICATIONS:  Outpatient Encounter Medications as of 05/16/2020  Medication Sig  . acetaminophen (TYLENOL) 650 MG CR tablet Take 2 tablets by mouth every 8 (eight) hours as needed.  . AMBULATORY NON FORMULARY MEDICATION Medication Name: Please dispense nebulizer and supplies. Medications sent to pharmacy. DX J44.9 DME: [facility]  . AMBULATORY NON FORMULARY MEDICATION Medication Name: Please dispense flutter valve. Use after each nebulizer treatment. DME: [facility] DX: J44.9  . arformoterol (BROVANA) 15 MCG/2ML NEBU USE 1 VIAL IN NEBULIZER TWO TIMES DAILY.  Marland Kitchen aspirin 81 MG EC tablet Take 1 tablet by mouth daily.   . budesonide (PULMICORT) 0.5 MG/2ML nebulizer solution USE 1 VIAL IN NEBULIZER TWO TIMES DAILY.  Marland Kitchen ipratropium-albuterol (DUONEB) 0.5-2.5 (3) MG/3ML SOLN Take 3 mLs by nebulization every 6 (six) hours as needed (sob wheezing).  . polyethylene glycol (MIRALAX / GLYCOLAX) packet Take 1 packet by mouth daily as needed.  . simvastatin (ZOCOR) 20 MG tablet Take 1 tablet by mouth daily.   No facility-administered encounter medications on file as of 05/16/2020.     Vanshika Jastrzebski Jenetta Downer, NP

## 2020-06-27 ENCOUNTER — Other Ambulatory Visit: Payer: Self-pay

## 2020-06-27 ENCOUNTER — Non-Acute Institutional Stay: Payer: Medicare Other | Admitting: Adult Health Nurse Practitioner

## 2020-06-27 DIAGNOSIS — I509 Heart failure, unspecified: Secondary | ICD-10-CM

## 2020-06-27 DIAGNOSIS — Z515 Encounter for palliative care: Secondary | ICD-10-CM

## 2020-06-27 NOTE — Progress Notes (Signed)
Therapist, nutritional Palliative Care Consult Note Telephone: [phone]  Fax: [phone]  PATIENT NAME: Kyle Frederick DOB: 1931/09/15 MRN: [id number]  PRIMARY CARE PROVIDER:   Gracelyn Nurse, MD  REFERRING PROVIDER:  Gracelyn Nurse, MD [address] [place],  [place] [address]  RESPONSIBLE PARTY:   Jolaine Artist, daughter H: [phone]  C: [phone]     RECOMMENDATIONS and PLAN:  1.  Advanced care planning.  Patient is a DNR.  Called daughter to update on visit.  Left voice message with reason for call and contact info  2.  Functional status patient is able to self propel in wheelchair.  He is unable to walk without dyspnea.  Requires assistance with ADLs and transfers to go to the bathroom.  Staff does not report any recent falls.  Continue supportive care at facility.  3.  CHF/COPD.  Patient's O2 is at 3.5 L today.  Has DOE but states that this is unchanged.  Does state having occasional cough and sometimes gets chest pain with the coughing.  Does sleep propped up on 2 pillows.  Denies headaches, dizziness, fever, pain, N/V/D.  Patient is very hard of hearing and unsure if he is able to understand providers questions.  Continue supportive care at facility.  No concerns reported by staff today.  No falls, infections, hospitalizations since last visit.  Patient seems stable at this time.  Palliative will continue to monitor for symptom management/decline and make recommendations as needed.  We will follow up in 6 to 8 weeks    I spent 30 minutes providing this consultation,  from 11:20 to 12:10 including time spent with patient/family, chart review, provider coordination, documentation. More than 50% of the time in this consultation was spent coordinating communication.   HISTORY OF PRESENT ILLNESS:  Kyle Frederick is a 86 y.o. year old male with multiple medical problems including CHF, COPD, history of CVA, HTN, anemia, depression. Palliative  Care was asked to help address goals of care.   CODE STATUS: DNR  PPS: 30% HOSPICE ELIGIBILITY/DIAGNOSIS: TBD  PHYSICAL EXAM:  HR 85  O2 99% on 3.5L General: NAD, frail appearing Cardiovascular: regular rate and rhythm Pulmonary: clear ant fields Abdomen: soft, nontender, + bowel sounds GU: no suprapubic tenderness Extremities: patient has noted edema and has compression hose in place, no joint deformities Skin: no rashes on exposed skin Neurological: Weakness but otherwise nonfocal; A&O x3; HOH  PAST MEDICAL HISTORY:  Past Medical History:  Diagnosis Date  . CHF (congestive heart failure) ([facility])   . COPD (chronic obstructive pulmonary disease) ([facility])   . Stroke [facility])     SOCIAL HX:  Social History   Tobacco Use  . Smoking status: Former Games developer  . Smokeless tobacco: Never Used  Substance Use Topics  . Alcohol use: Never    ALLERGIES:  Allergies  Allergen Reactions  . Lasix [Furosemide] Other (See Comments)    "syncope"     PERTINENT MEDICATIONS:  Outpatient Encounter Medications as of 06/27/2020  Medication Sig  . acetaminophen (TYLENOL) 650 MG CR tablet Take 2 tablets by mouth every 8 (eight) hours as needed.  . AMBULATORY NON FORMULARY MEDICATION Medication Name: Please dispense nebulizer and supplies. Medications sent to pharmacy. DX J44.9 DME: [facility]  . AMBULATORY NON FORMULARY MEDICATION Medication Name: Please dispense flutter valve. Use after each nebulizer treatment. DME: [facility] DX: J44.9  . arformoterol (BROVANA) 15 MCG/2ML NEBU USE 1 VIAL IN NEBULIZER TWO TIMES DAILY.  Marland Kitchen aspirin  81 MG EC tablet Take 1 tablet by mouth daily.   . budesonide (PULMICORT) 0.5 MG/2ML nebulizer solution USE 1 VIAL IN NEBULIZER TWO TIMES DAILY.  Marland Kitchen ipratropium-albuterol (DUONEB) 0.5-2.5 (3) MG/3ML SOLN Take 3 mLs by nebulization every 6 (six) hours as needed (sob wheezing).  . polyethylene glycol (MIRALAX / GLYCOLAX) packet Take 1 packet by mouth daily as needed.  . simvastatin (ZOCOR)  20 MG tablet Take 1 tablet by mouth daily.   No facility-administered encounter medications on file as of 06/27/2020.      Arantza Darrington Marlena Clipper, NP

## 2020-06-28 ENCOUNTER — Telehealth: Payer: Self-pay | Admitting: Adult Health Nurse Practitioner

## 2020-06-28 NOTE — Telephone Encounter (Signed)
Spoke with daughter to update on yesterday's visit.  She has no concerns right now.  Does have questions about paperwork for VA benefits.  States that she left the paperwork at [facility] and that the director has faxed it to [facility].  Will reach out to providers that come to facility to see if they have received the forms. Rosi Secrist K. Garner Nash NP

## 2020-06-29 ENCOUNTER — Telehealth: Payer: Self-pay | Admitting: Adult Health Nurse Practitioner

## 2020-06-29 NOTE — Telephone Encounter (Signed)
Returned daughter's VM.  Patient called her last night stating that they were mistreating him and wanted out of the facility.  Daughter did state that she was going to go see him this morning and that they were going to do check urine for infection.  Discussed that there wasn't much we could do right now until facility provider did testing to determine underlying reason for increased agitation and confusion and then treat based on results. Aislin Onofre K. Garner Nash NP

## 2020-08-08 ENCOUNTER — Non-Acute Institutional Stay: Payer: Medicare Other | Admitting: Adult Health Nurse Practitioner

## 2020-08-08 ENCOUNTER — Other Ambulatory Visit: Payer: Self-pay

## 2020-08-08 DIAGNOSIS — Z515 Encounter for palliative care: Secondary | ICD-10-CM

## 2020-08-08 DIAGNOSIS — I509 Heart failure, unspecified: Secondary | ICD-10-CM

## 2020-08-08 NOTE — Progress Notes (Signed)
Therapist, nutritional Palliative Care Consult Note Telephone: [phone]  Fax: [phone]  PATIENT NAME: Kyle Frederick DOB: 84-20-1932 MRN: [id number]  PRIMARY CARE PROVIDER:   Gracelyn Nurse, MD  REFERRING PROVIDER:  Gracelyn Nurse, MD [address] [place],  [place] [address]  RESPONSIBLE PARTY:   Kyle Frederick, daughter H: [phone]  C: [phone]    RECOMMENDATIONS and PLAN:  1.  Advanced care planning.  Patient is a DNR.  Called daughter to update on visit.  Left voice message with reason for call and contact info  2.  Functional status patient is able to self propel in wheelchair.  He is unable to walk without dyspnea.  Requires assistance with ADLs and transfers to go to the bathroom.  Staff does not report any recent falls.  Continue supportive care at facility.  3.  CHF/COPD.  Patient's O2 is at 3.5 L today.  Has DOE but states that this is unchanged.  Does state having occasional cough and sometimes gets chest pain with the coughing.  Does sleep propped up on 2 pillows. Patient states edema is unchanged.  Denies increased SOB or cough, headaches, dizziness, fever, pain, N/V/D, hematuria, dysuria.  Patient is very hard of hearing and unsure if he is able to understand providers questions.  Continue supportive care at facility.  No concerns reported by staff today.  No falls, infections, hospitalizations since last visit.  Patient seems stable at this time.  Palliative will continue to monitor for symptom management/decline and make recommendations as needed.  We will follow up in 6 to 8 weeks  I spent 30 minutes providing this consultation,  from 9:00 to 9:30 including time spent with patient/family, chart review, provider coordination, documentation. More than 50% of the time in this consultation was spent coordinating communication.   HISTORY OF PRESENT ILLNESS:  Kyle Frederick is a 86 y.o. year old male with multiple medical problems  including CHF, COPD, history of CVA, HTN, anemia, depression. Palliative Care was asked to help address goals of care.   CODE STATUS: DNR  PPS: 30% HOSPICE ELIGIBILITY/DIAGNOSIS: TBD  PHYSICAL EXAM:  HR 60  O2 99% on 3.5L General: NAD, frail appearing Cardiovascular: regular rate and rhythm Pulmonary: clear ant fields Abdomen: soft, nontender, + bowel sounds GU: no suprapubic tenderness Extremities: patient has noted edema and has compression hose in place, no joint deformities Skin: no rashes on exposed skin Neurological: Weakness but otherwise nonfocal; A&O x3; HOH  PAST MEDICAL HISTORY:  Past Medical History:  Diagnosis Date  . CHF (congestive heart failure) (HCC)   . COPD (chronic obstructive pulmonary disease) (HCC)   . Stroke [facility])     SOCIAL HX:  Social History   Tobacco Use  . Smoking status: Former Games developer  . Smokeless tobacco: Never Used  Substance Use Topics  . Alcohol use: Never    ALLERGIES:  Allergies  Allergen Reactions  . Lasix [Furosemide] Other (See Comments)    "syncope"     PERTINENT MEDICATIONS:  Outpatient Encounter Medications as of 08/08/2020  Medication Sig  . acetaminophen (TYLENOL) 650 MG CR tablet Take 2 tablets by mouth every 8 (eight) hours as needed.  . AMBULATORY NON FORMULARY MEDICATION Medication Name: Please dispense nebulizer and supplies. Medications sent to pharmacy. DX J44.9 DME: [facility]  . AMBULATORY NON FORMULARY MEDICATION Medication Name: Please dispense flutter valve. Use after each nebulizer treatment. DME: [facility] DX: J44.9  . arformoterol (BROVANA) 15 MCG/2ML NEBU USE 1 VIAL  IN NEBULIZER TWO TIMES DAILY.  Marland Kitchen aspirin 81 MG EC tablet Take 1 tablet by mouth daily.   . budesonide (PULMICORT) 0.5 MG/2ML nebulizer solution USE 1 VIAL IN NEBULIZER TWO TIMES DAILY.  Marland Kitchen ipratropium-albuterol (DUONEB) 0.5-2.5 (3) MG/3ML SOLN Take 3 mLs by nebulization every 6 (six) hours as needed (sob wheezing).  . polyethylene glycol (MIRALAX /  GLYCOLAX) packet Take 1 packet by mouth daily as needed.  . simvastatin (ZOCOR) 20 MG tablet Take 1 tablet by mouth daily.   No facility-administered encounter medications on file as of 08/08/2020.     Kyle Shin Marlena Clipper, NP

## 2020-08-28 ENCOUNTER — Ambulatory Visit: Payer: Medicare Other | Admitting: Internal Medicine

## 2020-10-31 ENCOUNTER — Other Ambulatory Visit: Payer: Self-pay

## 2020-10-31 ENCOUNTER — Non-Acute Institutional Stay: Payer: Medicare Other | Admitting: Adult Health Nurse Practitioner

## 2020-10-31 DIAGNOSIS — I509 Heart failure, unspecified: Secondary | ICD-10-CM

## 2020-10-31 DIAGNOSIS — Z515 Encounter for palliative care: Secondary | ICD-10-CM

## 2020-10-31 DIAGNOSIS — N5082 Scrotal pain: Secondary | ICD-10-CM

## 2020-10-31 DIAGNOSIS — J449 Chronic obstructive pulmonary disease, unspecified: Secondary | ICD-10-CM

## 2020-10-31 NOTE — Progress Notes (Signed)
Therapist, nutritional Palliative Care Consult Note Telephone: [phone]  Fax: [phone]  PATIENT NAME: Kyle Frederick DOB: 1931/06/24 MRN: [id number]  PRIMARY CARE PROVIDER:   Smiley Houseman NP  REFERRING PROVIDER:  Smiley Houseman NP  RESPONSIBLE PARTY:  Jolaine Artist, daughter H: [phone]  C: [phone]  Chief complaint:  Follow up palliative visit; edema   RECOMMENDATIONS and PLAN: 1.Advanced care planning. Patient is a DNR. Called daughter to update on visit. Left voice message with reason for call and contact info  2.  Scrotal pain.  This is a new complaint by the patient. Though no obvious swelling, redness, or warmth to touch today believe he may have swelling and associated pain related to edema that comes and goes.  He is on bumetanide 1 mg daily.  Have reached out to his primary with this concern and possible adjustment of his bumetanide.    3. CHF.  Patient with wraps to bilateral legs.  On bumetanide 1 mg.  Still having issues with edema which may be causing scrotal pain and have reached out to PCP, see above.  4.  COPD.  Patient is on O2 @ 3.5L continuous.  This is unchanged.  Patient not complaining of increased DOE today.  Continue current COPD meds.  No concerns reported by staff today. No falls, infections, hospitalizations since last visit. Palliative will continue to monitor for symptom management/decline and make recommendations as needed. Will follow up in 6-8 weeks   HISTORY OF PRESENT ILLNESS:  Kyle Frederick is a 86 y.o. year old male with multiple medical problems including CHF, COPD, history of CVA, HTN, anemia, depression. Palliative Care was asked to help address goals of care. Patient complains today of swelling and pain in scrotum. States that the pain is "bad" (would not use pain scale) and that it has been there for a long time.  This is the first time he has mentioned it to this provider and staff  reports that he has not complained of scrotal pain or swelling to them.  Patient does state that sometimes his SOB and cough worsens but at baseline today.  Patient's lung sounds are clear.  Patient does have wraps to bilateral legs with edema noted to bilateral feet and edema noted under his left eye.   Patient denies any other pain, headaches, dizziness, N/V/D, constipation, dysuria, hematuria.  Functional status unchanged.  He is able to propel himself in his wheelchair.  Though staff do report that he needs encouragement to do this himself.  He requires assistance with ADLs.  Is able to transfer from wheelchair to toilet.  Has occasional bladder incontinence.  CODE STATUS: DNR  PPS: 30% HOSPICE ELIGIBILITY/DIAGNOSIS: TBD  PHYSICAL EXAM: BP 132/66  HR 81 O2 90% on 3.5L General: NAD, frail appearing Eyes:  Sclera anicteric and noninjected with no drainage noted.  Does have edema noted under left eye Cardiovascular: regular rate and rhythm Pulmonary: lung sounds clear; normal respiratory effort Abdomen: soft, nontender, + bowel sounds GU: Scrotum appears nonedematous with no redness or warmth to touch noted.   Extremities:Patient has wraps in place on bilateral legs.  Edema noted to bilateral feet with more on right than left.  Skin: no rasheson exposed skin Neurological: Weakness but otherwise nonfocal; A&O x3; HOH  PAST MEDICAL HISTORY:  Past Medical History:  Diagnosis Date   CHF (congestive heart failure) (HCC)    COPD (chronic obstructive pulmonary disease) (HCC)    Stroke (HCC)  SOCIAL HX:  Social History   Tobacco Use   Smoking status: Former Smoker   Smokeless tobacco: Never Used  Substance Use Topics   Alcohol use: Never    ALLERGIES:  Allergies  Allergen Reactions   Lasix [Furosemide] Other (See Comments)    "syncope"     PERTINENT MEDICATIONS:  Outpatient Encounter Medications as of 10/31/2020  Medication Sig   acetaminophen (TYLENOL) 650 MG CR  tablet Take 2 tablets by mouth every 8 (eight) hours as needed.   AMBULATORY NON FORMULARY MEDICATION Medication Name: Please dispense nebulizer and supplies. Medications sent to pharmacy. DX J44.9 DME: [facility]   AMBULATORY NON FORMULARY MEDICATION Medication Name: Please dispense flutter valve. Use after each nebulizer treatment. DME: [facility] DX: J44.9   arformoterol (BROVANA) 15 MCG/2ML NEBU USE 1 VIAL IN NEBULIZER TWO TIMES DAILY.   aspirin 81 MG EC tablet Take 1 tablet by mouth daily.    budesonide (PULMICORT) 0.5 MG/2ML nebulizer solution USE 1 VIAL IN NEBULIZER TWO TIMES DAILY.   ipratropium-albuterol (DUONEB) 0.5-2.5 (3) MG/3ML SOLN Take 3 mLs by nebulization every 6 (six) hours as needed (sob wheezing).   polyethylene glycol (MIRALAX / GLYCOLAX) packet Take 1 packet by mouth daily as needed.   simvastatin (ZOCOR) 20 MG tablet Take 1 tablet by mouth daily.   No facility-administered encounter medications on file as of 10/31/2020.     Ericka Marcellus Marlena Clipper, NP

## 2020-12-14 ENCOUNTER — Non-Acute Institutional Stay: Payer: Medicare Other | Admitting: Adult Health Nurse Practitioner

## 2020-12-14 ENCOUNTER — Other Ambulatory Visit: Payer: Self-pay

## 2020-12-14 DIAGNOSIS — J449 Chronic obstructive pulmonary disease, unspecified: Secondary | ICD-10-CM

## 2020-12-14 DIAGNOSIS — I509 Heart failure, unspecified: Secondary | ICD-10-CM

## 2020-12-14 DIAGNOSIS — R4189 Other symptoms and signs involving cognitive functions and awareness: Secondary | ICD-10-CM

## 2020-12-14 DIAGNOSIS — Z515 Encounter for palliative care: Secondary | ICD-10-CM

## 2020-12-14 NOTE — Progress Notes (Signed)
Therapist, nutritional Palliative Care Consult Note Telephone: [phone]  Fax: [phone]  PATIENT NAME: Kyle Frederick DOB: 1931-01-06 MRN: [id number]  PRIMARY CARE PROVIDER:   Smiley Houseman NP  REFERRING PROVIDER:  Smiley Houseman NP  RESPONSIBLE PARTY:  Jolaine Artist, daughter H: [phone]  C: [phone]  Chief complaint:  Follow up palliative visit; edema   RECOMMENDATIONS and PLAN:  1.  Advanced care planning. Patient is a DNR. Spoke with daughter via phone to update on visit.  2.  CHF.  Patient doing well with current diuretic and edema is lessened from last visit.  Continue current CHF meds.    3.  COPD. Stable; continue current COPD medications  4.  Cognitive decline.  Patient may be having cognitive decline and will continue to assess at future visits.  Will recommend transition to memory care in the future if needed.  Palliative will continue to monitor for symptom management/decline and make recommendations as needed. Will follow up in 6-8 weeks  I spent 30 minutes providing this consultation. More than 50% of the time in this consultation was spent coordinating communication.   HISTORY OF PRESENT ILLNESS:  Kyle Frederick is a 87 y.o. year old male with multiple medical problems including CHF, COPD, history of CVA, HTN, anemia, depression. Palliative Care was asked to help address goals of care. Unable to obtain accurate HPI/ROS.  Patient not answering questions very well today.  Will reply "uh-huh" or "I'm not sure" to most questions.  Daughter does state that here lately he has been calling her and not making sense on the phone.  She will have to call the facility and ask staff what he is wanting.  Staff have no new concerns. Patient has not had any falls, infections, or hospital visits since last visit.    CODE STATUS: DNR  PPS: 30% HOSPICE ELIGIBILITY/DIAGNOSIS: TBD  PHYSICAL EXAM:  HR 76  O2 96% on 3.5 L General: NAD,  frail appearing Eyes:  Sclera anicteric and noninjected with no drainage noted.  Does have edema noted under left eye Cardiovascular: regular rate and rhythm Pulmonary: lung sounds clear; normal respiratory effort Abdomen: soft, nontender, + bowel sounds Extremities: 1-2+ edema, no joint deformities Skin: no rashes on exposed skin Neurological: Weakness;  HOH;  Patient not answering questions today.  Unclear if he is unable to hear provider with mask on or if he not understanding what is being said; could be combination of both   PAST MEDICAL HISTORY:  Past Medical History:  Diagnosis Date  . CHF (congestive heart failure) (HCC)   . COPD (chronic obstructive pulmonary disease) (HCC)   . Stroke [facility])     SOCIAL HX:  Social History   Tobacco Use  . Smoking status: Former Games developer  . Smokeless tobacco: Never Used  Substance Use Topics  . Alcohol use: Never    ALLERGIES:  Allergies  Allergen Reactions  . Lasix [Furosemide] Other (See Comments)    "syncope"     PERTINENT MEDICATIONS:  Outpatient Encounter Medications as of 12/14/2020  Medication Sig  . acetaminophen (TYLENOL) 650 MG CR tablet Take 2 tablets by mouth every 8 (eight) hours as needed.  . AMBULATORY NON FORMULARY MEDICATION Medication Name: Please dispense nebulizer and supplies. Medications sent to pharmacy. DX J44.9 DME: [facility]  . AMBULATORY NON FORMULARY MEDICATION Medication Name: Please dispense flutter valve. Use after each nebulizer treatment. DME: [facility] DX: J44.9  . arformoterol (BROVANA) 15 MCG/2ML NEBU USE 1 VIAL IN NEBULIZER  TWO TIMES DAILY.  Marland Kitchen aspirin 81 MG EC tablet Take 1 tablet by mouth daily.   . budesonide (PULMICORT) 0.5 MG/2ML nebulizer solution USE 1 VIAL IN NEBULIZER TWO TIMES DAILY.  Marland Kitchen ipratropium-albuterol (DUONEB) 0.5-2.5 (3) MG/3ML SOLN Take 3 mLs by nebulization every 6 (six) hours as needed (sob wheezing).  . polyethylene glycol (MIRALAX / GLYCOLAX) packet Take 1 packet by mouth daily as  needed.  . simvastatin (ZOCOR) 20 MG tablet Take 1 tablet by mouth daily.   No facility-administered encounter medications on file as of 12/14/2020.     Ravan Schlemmer Marlena Clipper, NP

## 2021-01-12 ENCOUNTER — Emergency Department: Payer: Medicare Other

## 2021-01-12 ENCOUNTER — Emergency Department
Admission: EM | Admit: 2021-01-12 | Discharge: 2021-01-12 | Disposition: A | Discharge status: Other Acute Inpatient Hospital | Payer: Medicare Other | Attending: Emergency Medicine | Admitting: Emergency Medicine

## 2021-01-12 ENCOUNTER — Other Ambulatory Visit: Payer: Self-pay

## 2021-01-12 DIAGNOSIS — I5031 Acute diastolic (congestive) heart failure: Secondary | ICD-10-CM | POA: Insufficient documentation

## 2021-01-12 DIAGNOSIS — Z7982 Long term (current) use of aspirin: Secondary | ICD-10-CM | POA: Diagnosis not present

## 2021-01-12 DIAGNOSIS — Z87891 Personal history of nicotine dependence: Secondary | ICD-10-CM | POA: Insufficient documentation

## 2021-01-12 DIAGNOSIS — I110 Hypertensive heart disease with heart failure: Secondary | ICD-10-CM | POA: Insufficient documentation

## 2021-01-12 DIAGNOSIS — R0609 Other forms of dyspnea: Secondary | ICD-10-CM | POA: Diagnosis not present

## 2021-01-12 DIAGNOSIS — J189 Pneumonia, unspecified organism: Secondary | ICD-10-CM

## 2021-01-12 DIAGNOSIS — J181 Lobar pneumonia, unspecified organism: Secondary | ICD-10-CM | POA: Insufficient documentation

## 2021-01-12 DIAGNOSIS — Z79899 Other long term (current) drug therapy: Secondary | ICD-10-CM | POA: Insufficient documentation

## 2021-01-12 DIAGNOSIS — Z8673 Personal history of transient ischemic attack (TIA), and cerebral infarction without residual deficits: Secondary | ICD-10-CM | POA: Diagnosis not present

## 2021-01-12 DIAGNOSIS — R350 Frequency of micturition: Secondary | ICD-10-CM

## 2021-01-12 DIAGNOSIS — J441 Chronic obstructive pulmonary disease with (acute) exacerbation: Secondary | ICD-10-CM | POA: Diagnosis not present

## 2021-01-12 DIAGNOSIS — Z7951 Long term (current) use of inhaled steroids: Secondary | ICD-10-CM | POA: Diagnosis not present

## 2021-01-12 LAB — BASIC METABOLIC PANEL
Anion gap: 9 (ref 5–15)
BUN: 31 mg/dL — ABNORMAL HIGH (ref 8–23)
CO2: 37 mmol/L — ABNORMAL HIGH (ref 22–32)
Calcium: 9.6 mg/dL (ref 8.9–10.3)
Chloride: 94 mmol/L — ABNORMAL LOW (ref 98–111)
Creatinine, Ser: 1.25 mg/dL — ABNORMAL HIGH (ref 0.61–1.24)
GFR, Estimated: 56 mL/min — ABNORMAL LOW (ref >60)
Glucose, Bld: 98 mg/dL (ref 70–99)
Potassium: 4.7 mmol/L (ref 3.5–5.1)
Sodium: 140 mmol/L (ref 135–145)

## 2021-01-12 LAB — CBC WITH DIFFERENTIAL/PLATELET
Abs Immature Granulocytes: 0.01 10*3/uL (ref 0.00–0.07)
Basophils Absolute: 0 10*3/uL (ref 0.0–0.1)
Basophils Relative: 0 %
Eosinophils Absolute: 0.2 10*3/uL (ref 0.0–0.5)
Eosinophils Relative: 3 %
HCT: 33.5 % — ABNORMAL LOW (ref 39.0–52.0)
Hemoglobin: 9.6 g/dL — ABNORMAL LOW (ref 13.0–17.0)
Immature Granulocytes: 0 %
Lymphocytes Relative: 16 %
Lymphs Abs: 1 10*3/uL (ref 0.7–4.0)
MCH: 26.1 pg (ref 26.0–34.0)
MCHC: 28.7 g/dL — ABNORMAL LOW (ref 30.0–36.0)
MCV: 91 fL (ref 80.0–100.0)
Monocytes Absolute: 0.6 10*3/uL (ref 0.1–1.0)
Monocytes Relative: 9 %
Neutro Abs: 4.5 10*3/uL (ref 1.7–7.7)
Neutrophils Relative %: 72 %
Platelets: 186 10*3/uL (ref 150–400)
RBC: 3.68 MIL/uL — ABNORMAL LOW (ref 4.22–5.81)
RDW: 13.2 % (ref 11.5–15.5)
WBC: 6.4 10*3/uL (ref 4.0–10.5)
nRBC: 0 % (ref 0.0–0.2)

## 2021-01-12 LAB — URINALYSIS, COMPLETE (UACMP) WITH MICROSCOPIC
Bilirubin Urine: NEGATIVE
Glucose, UA: NEGATIVE mg/dL
Hgb urine dipstick: NEGATIVE
Ketones, ur: NEGATIVE mg/dL
Nitrite: NEGATIVE
Protein, ur: NEGATIVE mg/dL
Specific Gravity, Urine: 1.016 (ref 1.005–1.030)
pH: 6 (ref 5.0–8.0)

## 2021-01-12 LAB — BRAIN NATRIURETIC PEPTIDE: B Natriuretic Peptide: 30.5 pg/mL (ref 0.0–100.0)

## 2021-01-12 MED ORDER — AZITHROMYCIN 250 MG PO TABS
ORAL_TABLET | ORAL | 0 refills | Status: AC
Start: 2021-01-12 — End: 2021-01-17

## 2021-01-12 MED ORDER — PHENAZOPYRIDINE HCL 200 MG PO TABS
200.0000 mg | ORAL_TABLET | Freq: Three times a day (TID) | ORAL | 0 refills | Status: DC | PRN
Start: 2021-01-12 — End: 2021-02-19

## 2021-01-12 NOTE — ED Triage Notes (Signed)
Per EMS pt has had urinary frequency and burning for 2 weeks- pt is on 4L [place] chronically and has swelling in feet that is normal per facility- pt is A&O x4

## 2021-01-12 NOTE — ED Provider Notes (Signed)
[facility] Emergency Department Provider Note   ____________________________________________   Event Date/Time   First MD Initiated Contact with Patient 01/12/21 [phone]     (approximate)  I have reviewed the triage vital signs and the nursing notes.   HISTORY  Chief Complaint Urinary Frequency    HPI Kyle Frederick is a 87 y.o. male patient arrived via EMS from palliative care facility complaining of urinary frequency and burning for 2 weeks.  Patient has history of CHF/ COPD and is on 4 L of oxygen by nasal cannula.  Patient denies chest pain.  States dyspnea only when not using supplementary using oxygen.  Patient is alert and orientated this time.         Past Medical History:  Diagnosis Date  . CHF (congestive heart failure) (HCC)   . COPD (chronic obstructive pulmonary disease) (HCC)   . Stroke [facility])     Patient Active Problem List   Diagnosis Date Noted  . COPD with chronic bronchitis (HCC) 04/27/2020  . Metabolic alkalosis with respiratory acidosis 04/27/2020  . Acute on chronic respiratory failure with hypercapnia (HCC) 04/27/2020  . Hypercapnemia 04/27/2020  . Acute diastolic CHF (congestive heart failure) (HCC) 04/28/2018  . Acute exacerbation of chronic obstructive pulmonary disease (COPD) (HCC) 04/25/2018  . CVA, old, aphasia 04/25/2018  . Interstitial lung disease (HCC) 04/25/2018  . Pressure injury of skin 04/25/2018  . Anemia due to chronic illness 11/12/2017  . HTN (hypertension) 06/21/2014    History reviewed. No pertinent surgical history.  Prior to Admission medications   Medication Sig Start Date End Date Taking? Authorizing Provider  azithromycin (ZITHROMAX Z-PAK) 250 MG tablet Take 2 tablets (500 mg) on  Day 1,  followed by 1 tablet (250 mg) once daily on Days 2 through 5. 01/12/21 01/17/21 Yes Joni Reining, PA-C  phenazopyridine (PYRIDIUM) 200 MG tablet Take 1 tablet (200 mg total) by mouth 3 (three) times daily  as needed for pain. 01/12/21  Yes Joni Reining, PA-C  acetaminophen (TYLENOL) 650 MG CR tablet Take 2 tablets by mouth every 8 (eight) hours as needed.    [provider]  AMBULATORY NON FORMULARY MEDICATION Medication Name: Please dispense nebulizer and supplies. Medications sent to pharmacy. DX J44.9 DME: [facility] 05/05/18   Shane Crutch, MD  AMBULATORY NON FORMULARY MEDICATION Medication Name: Please dispense flutter valve. Use after each nebulizer treatment. DME: [facility] DX: J44.9 05/05/18   Shane Crutch, MD  arformoterol (BROVANA) 15 MCG/2ML NEBU USE 1 VIAL IN NEBULIZER TWO TIMES DAILY. 02/21/19   Shane Crutch, MD  aspirin 81 MG EC tablet Take 1 tablet by mouth daily.     [provider]  budesonide (PULMICORT) 0.5 MG/2ML nebulizer solution USE 1 VIAL IN NEBULIZER TWO TIMES DAILY. 04/06/19   Shane Crutch, MD  ipratropium-albuterol (DUONEB) 0.5-2.5 (3) MG/3ML SOLN Take 3 mLs by nebulization every 6 (six) hours as needed (sob wheezing). 04/28/18   Adrian Saran, MD  polyethylene glycol (MIRALAX / GLYCOLAX) packet Take 1 packet by mouth daily as needed.    [provider]  simvastatin (ZOCOR) 20 MG tablet Take 1 tablet by mouth daily. 02/09/18   [provider]    Allergies Lasix [furosemide]  No family history on file.  Social History Social History   Tobacco Use  . Smoking status: Former Games developer  . Smokeless tobacco: Never Used  Substance Use Topics  . Alcohol use: Never  . Drug use: Never    Review of Systems  Constitutional: No fever/chills Eyes: No visual changes. ENT: No sore throat. Cardiovascular: Denies chest pain.  History of congestive heart failure Respiratory: Denies shortness of breath.  History of COPD Gastrointestinal: No abdominal pain.  No nausea, no vomiting.  No diarrhea.  No constipation. Genitourinary: Negative for dysuria. Musculoskeletal: Negative for back pain. Skin: Negative for  rash. Neurological: Negative for headaches, focal weakness or numbness. Endocrine:  Hypertension Hematological/Lymphatic:  Anemia Allergic/Immunilogical: Latex.  ____________________________________________   PHYSICAL EXAM:  VITAL SIGNS: ED Triage Vitals  Enc Vitals Group     BP --      Pulse Rate 01/12/21 0902 (!) 55     Resp 01/12/21 0902 (!) 22     Temp 01/12/21 0902 98.4 F (36.9 C)     Temp Source 01/12/21 0902 Oral     SpO2 01/12/21 0902 100 %     Weight 01/12/21 0903 260 lb (117.9 kg)     Height 01/12/21 0903 5\' 10"  (1.778 m)     Head Circumference --      Peak Flow --      Pain Score 01/12/21 0903 0     Pain Loc --      Pain Edu? --      Excl. in GC? --    Constitutional: Alert and oriented. Well appearing and in no acute distress. Eyes: Conjunctivae are normal. PERRL. EOMI. Head: Atraumatic. Nose: No congestion/rhinnorhea. Mouth/Throat: Mucous membranes are moist.  Oropharynx non-erythematous. Neck: No stridor.  Cardiovascular: Bradycardic, regular rhythm. Grossly normal heart sounds.  Good peripheral circulation.   Respiratory: Normal respiratory effort.  No retractions. Lungs with bilateral rhonchi. Gastrointestinal: Soft and nontender. No distention. No abdominal bruits. No CVA tenderness. Genitourinary: No penile lesion and able to void. Musculoskeletal: +1 edema bilateral lower extremity.   Neurologic:  Normal speech and language. No gross focal neurologic deficits are appreciated. No gait instability. Skin:  Skin is warm, dry and intact. No rash noted. Psychiatric: Mood and affect are normal. Speech and behavior are normal.  ____________________________________________   LABS (all labs ordered are listed, but only abnormal results are displayed)  Labs Reviewed  BASIC METABOLIC PANEL - Abnormal; Notable for the following components:      Result Value   Chloride 94 (*)    CO2 37 (*)    BUN 31 (*)    Creatinine, Ser 1.25 (*)    GFR, Estimated 56  (*)    All other components within normal limits  CBC WITH DIFFERENTIAL/PLATELET - Abnormal; Notable for the following components:   RBC 3.68 (*)    Hemoglobin 9.6 (*)    HCT 33.5 (*)    MCHC 28.7 (*)    All other components within normal limits  URINALYSIS, COMPLETE (UACMP) WITH MICROSCOPIC - Abnormal; Notable for the following components:   Color, Urine YELLOW (*)    APPearance CLEAR (*)    Leukocytes,Ua TRACE (*)    Bacteria, UA RARE (*)    All other components within normal limits  URINE CULTURE  BRAIN NATRIURETIC PEPTIDE   ____________________________________________  EKG   ____________________________________________  RADIOLOGY I, 03/12/21, personally viewed and evaluated these images (plain radiographs) as part of my medical decision making, as well as reviewing the written report by the radiologist.  ED MD interpretation: Mid and lower left lung findings consistent with a pneumonia.  Official radiology report(s): DG Chest 2 View  Result Date: 01/12/2021 CLINICAL DATA:  Rhonchi EXAM: CHEST - 2 VIEW COMPARISON:  Apr 26, 2020 FINDINGS:  There is ill-defined patchy airspace opacity throughout the left mid and lower lung regions as well as to a lesser degree in the right base. Heart is mildly enlarged with a degree of pulmonary venous hypertension. No adenopathy. No bone lesions. IMPRESSION: Cardiomegaly with a degree of pulmonary vascular congestion. Ill-defined opacity left mid and lower lung regions and to a lesser extent in the right base. The persistence of these findings raise concern for scarring in these areas. A degree of chronic pulmonary edema with a degree of chronic congestive heart failure could present in this manner. A degree of superimposed pneumonia, particularly on the left, is difficult to exclude in this circumstance. Check of COVID-19 status may be advisable in this regard. Electronically Signed   By: Bretta Bang III M.D.   On: 01/12/2021 10:21     ____________________________________________   PROCEDURES  Procedure(s) performed (including Critical Care):  Procedures   ____________________________________________   INITIAL IMPRESSION / ASSESSMENT AND PLAN / ED COURSE  As part of my medical decision making, I reviewed the following data within the electronic MEDICAL RECORD NUMBER         Patient presents with 2 weeks of urinary frequency and burning.  Differentials consist of UTI, BPH, and overactive bladder.  Discussed lab results with patient and sister which is not consistent with urinary tract infection.  Advised definitive work-up by urologist via palliative care referral.  Discussed chest x-ray findings with patient and sister.  Findings consistent for early onset of left mid and lower lobe pneumonia.  Patient given discharge care instructions and a prescription for Z-Pak and Pyridium.  Recommend in-house COVID-19 test.      ____________________________________________   FINAL CLINICAL IMPRESSION(S) / ED DIAGNOSES  Final diagnoses:  Urinary frequency  Community acquired pneumonia of left upper lobe of lung     ED Discharge Orders         Ordered    phenazopyridine (PYRIDIUM) 200 MG tablet  3 times daily PRN        01/12/21 1052    azithromycin (ZITHROMAX Z-PAK) 250 MG tablet        01/12/21 1057          *Please note:  MYKEAL CARRICK was evaluated in Emergency Department on 01/12/2021 for the symptoms described in the history of present illness. He was evaluated in the context of the global COVID-19 pandemic, which necessitated consideration that the patient might be at risk for infection with the SARS-CoV-2 virus that causes COVID-19. Institutional protocols and algorithms that pertain to the evaluation of patients at risk for COVID-19 are in a state of rapid change based on information released by regulatory bodies including the CDC and federal and state organizations. These policies and algorithms were  followed during the patient's care in the ED.  Some ED evaluations and interventions may be delayed as a result of limited staffing during and the pandemic.*   Note:  This document was prepared using Dragon voice recognition software and may include unintentional dictation errors.    Joni Reining, PA-C 01/12/21 1107    Delton Prairie, MD 01/12/21 [phone]

## 2021-01-12 NOTE — Discharge Instructions (Addendum)
Your labs today were not consistent with urinary tract infection.  X-ray findings consistent with early pneumonia left mid and lower lobes.  Follow discharge care instruction take medication as directed.  Advised consult with urology for further evaluation of overactive bladder or BPH.  Advise in-house Covid test.

## 2021-01-13 LAB — URINE CULTURE: Special Requests: NORMAL

## 2021-02-19 ENCOUNTER — Emergency Department: Payer: Medicare Other

## 2021-02-19 ENCOUNTER — Other Ambulatory Visit: Payer: Self-pay

## 2021-02-19 ENCOUNTER — Emergency Department
Admission: EM | Admit: 2021-02-19 | Discharge: 2021-02-19 | Disposition: A | Discharge status: Home / Self Care | Payer: Medicare Other | Attending: Emergency Medicine | Admitting: Emergency Medicine

## 2021-02-19 DIAGNOSIS — Z7982 Long term (current) use of aspirin: Secondary | ICD-10-CM | POA: Insufficient documentation

## 2021-02-19 DIAGNOSIS — J449 Chronic obstructive pulmonary disease, unspecified: Secondary | ICD-10-CM | POA: Diagnosis not present

## 2021-02-19 DIAGNOSIS — R1032 Left lower quadrant pain: Secondary | ICD-10-CM | POA: Insufficient documentation

## 2021-02-19 DIAGNOSIS — I110 Hypertensive heart disease with heart failure: Secondary | ICD-10-CM | POA: Insufficient documentation

## 2021-02-19 DIAGNOSIS — Z87891 Personal history of nicotine dependence: Secondary | ICD-10-CM | POA: Insufficient documentation

## 2021-02-19 DIAGNOSIS — R600 Localized edema: Secondary | ICD-10-CM | POA: Diagnosis not present

## 2021-02-19 DIAGNOSIS — I5031 Acute diastolic (congestive) heart failure: Secondary | ICD-10-CM | POA: Diagnosis not present

## 2021-02-19 DIAGNOSIS — R1030 Lower abdominal pain, unspecified: Secondary | ICD-10-CM

## 2021-02-19 LAB — URINALYSIS, COMPLETE (UACMP) WITH MICROSCOPIC
Bacteria, UA: NONE SEEN
Bilirubin Urine: NEGATIVE
Glucose, UA: NEGATIVE mg/dL
Hgb urine dipstick: NEGATIVE
Ketones, ur: NEGATIVE mg/dL
Leukocytes,Ua: NEGATIVE
Nitrite: NEGATIVE
Protein, ur: NEGATIVE mg/dL
Specific Gravity, Urine: 1.013 (ref 1.005–1.030)
Squamous Epithelial / HPF: NONE SEEN (ref 0–5)
pH: 8 (ref 5.0–8.0)

## 2021-02-19 LAB — COMPREHENSIVE METABOLIC PANEL
ALT: 12 U/L (ref 0–44)
AST: 22 U/L (ref 15–41)
Albumin: 4.2 g/dL (ref 3.5–5.0)
Alkaline Phosphatase: 62 U/L (ref 38–126)
Anion gap: 7 (ref 5–15)
BUN: 37 mg/dL — ABNORMAL HIGH (ref 8–23)
CO2: 38 mmol/L — ABNORMAL HIGH (ref 22–32)
Calcium: 9.8 mg/dL (ref 8.9–10.3)
Chloride: 92 mmol/L — ABNORMAL LOW (ref 98–111)
Creatinine, Ser: 1.23 mg/dL (ref 0.61–1.24)
GFR, Estimated: 57 mL/min — ABNORMAL LOW (ref >60)
Glucose, Bld: 95 mg/dL (ref 70–99)
Potassium: 4.4 mmol/L (ref 3.5–5.1)
Sodium: 137 mmol/L (ref 135–145)
Total Bilirubin: 0.6 mg/dL (ref 0.3–1.2)
Total Protein: 7.6 g/dL (ref 6.5–8.1)

## 2021-02-19 LAB — LIPASE, BLOOD: Lipase: 28 U/L (ref 11–51)

## 2021-02-19 LAB — CBC
HCT: 35.3 % — ABNORMAL LOW (ref 39.0–52.0)
Hemoglobin: 10.3 g/dL — ABNORMAL LOW (ref 13.0–17.0)
MCH: 26.1 pg (ref 26.0–34.0)
MCHC: 29.2 g/dL — ABNORMAL LOW (ref 30.0–36.0)
MCV: 89.4 fL (ref 80.0–100.0)
Platelets: 197 10*3/uL (ref 150–400)
RBC: 3.95 MIL/uL — ABNORMAL LOW (ref 4.22–5.81)
RDW: 13.3 % (ref 11.5–15.5)
WBC: 7.5 10*3/uL (ref 4.0–10.5)
nRBC: 0.4 % — ABNORMAL HIGH (ref 0.0–0.2)

## 2021-02-19 MED ORDER — IOHEXOL 300 MG/ML  SOLN
125.0000 mL | Freq: Once | INTRAMUSCULAR | Status: AC | PRN
  Administered 2021-02-19: 125 mL via INTRAVENOUS

## 2021-02-19 MED ORDER — TAMSULOSIN HCL 0.4 MG PO CAPS: 0.4000 mg | ORAL_CAPSULE | Freq: Every day | ORAL | 0 refills | Status: AC

## 2021-02-19 MED ORDER — PHENAZOPYRIDINE HCL 200 MG PO TABS
200.0000 mg | ORAL_TABLET | Freq: Three times a day (TID) | ORAL | 0 refills | Status: AC | PRN
Start: 2021-02-19 — End: 2022-02-19

## 2021-02-19 MED ORDER — IOHEXOL 9 MG/ML PO SOLN
1000.0000 mL | ORAL | Status: AC | PRN
  Administered 2021-02-19: 1000 mL via ORAL

## 2021-02-19 NOTE — ED Notes (Signed)
3 failed IV attempts by RN and Consulting civil engineer. Dr. Lenard Lance notified. MD advised he will attempt [place] IV.

## 2021-02-19 NOTE — ED Notes (Signed)
Patient drinking oral contrast.

## 2021-02-19 NOTE — ED Notes (Addendum)
EMS are here

## 2021-02-19 NOTE — ED Triage Notes (Signed)
Presented via EMS from [facility]. Pt. Complaining of abdominal pain since last night. A&0 only to self but baseline for patient. NAD upon arrival.

## 2021-02-19 NOTE — Discharge Instructions (Addendum)
As we discussed please follow-up with your primary care doctor regarding her abdominal discomfort and enlarged prostate.  Please take your medications as prescribed, as needed.  Return to the emergency department for any symptom personally concerning to yourself, family, or staff members.

## 2021-02-19 NOTE — ED Provider Notes (Signed)
[facility] Emergency Department Provider Note  Time seen: 7:44 AM  I have reviewed the triage vital signs and the nursing notes.   HISTORY  Chief Complaint Abdominal pain  HPI Kyle Frederick is a 87 y.o. male with a past medical history of CHF, COPD, CVA, presents to the emergency department left lower quadrant abdominal pain.  According to report and the patient since this morning he has been experiencing pain across the lower abdomen mostly in the left lower quadrant.  No reported vomiting diarrhea or dysuria.  No fever.  Patient does have CHF and has chronic lower extremity swelling.  Per EMS patient is on chronic oxygen and has mild confusion at baseline.  Currently the patient appears well, no other complaints besides moderate left lower quadrant abdominal pain.   Past Medical History:  Diagnosis Date  . CHF (congestive heart failure) (HCC)   . COPD (chronic obstructive pulmonary disease) (HCC)   . Stroke [facility])     Patient Active Problem List   Diagnosis Date Noted  . COPD with chronic bronchitis (HCC) 04/27/2020  . Metabolic alkalosis with respiratory acidosis 04/27/2020  . Acute on chronic respiratory failure with hypercapnia (HCC) 04/27/2020  . Hypercapnemia 04/27/2020  . Acute diastolic CHF (congestive heart failure) (HCC) 04/28/2018  . Acute exacerbation of chronic obstructive pulmonary disease (COPD) (HCC) 04/25/2018  . CVA, old, aphasia 04/25/2018  . Interstitial lung disease (HCC) 04/25/2018  . Pressure injury of skin 04/25/2018  . Anemia due to chronic illness 11/12/2017  . HTN (hypertension) 06/21/2014    No past surgical history on file.  Prior to Admission medications   Medication Sig Start Date End Date Taking? Authorizing Provider  acetaminophen (TYLENOL) 650 MG CR tablet Take 2 tablets by mouth every 8 (eight) hours as needed.    [provider]  AMBULATORY NON FORMULARY MEDICATION Medication Name: Please dispense  nebulizer and supplies. Medications sent to pharmacy. DX J44.9 DME: [facility] 05/05/18   Shane Crutch, MD  AMBULATORY NON FORMULARY MEDICATION Medication Name: Please dispense flutter valve. Use after each nebulizer treatment. DME: [facility] DX: J44.9 05/05/18   Shane Crutch, MD  arformoterol (BROVANA) 15 MCG/2ML NEBU USE 1 VIAL IN NEBULIZER TWO TIMES DAILY. 02/21/19   Shane Crutch, MD  aspirin 81 MG EC tablet Take 1 tablet by mouth daily.     [provider]  budesonide (PULMICORT) 0.5 MG/2ML nebulizer solution USE 1 VIAL IN NEBULIZER TWO TIMES DAILY. 04/06/19   Shane Crutch, MD  ipratropium-albuterol (DUONEB) 0.5-2.5 (3) MG/3ML SOLN Take 3 mLs by nebulization every 6 (six) hours as needed (sob wheezing). 04/28/18   Adrian Saran, MD  phenazopyridine (PYRIDIUM) 200 MG tablet Take 1 tablet (200 mg total) by mouth 3 (three) times daily as needed for pain. 01/12/21   Joni Reining, PA-C  polyethylene glycol [facility] Ethelene Hal) packet Take 1 packet by mouth daily as needed.    [provider]  simvastatin (ZOCOR) 20 MG tablet Take 1 tablet by mouth daily. 02/09/18   [provider]    Allergies  Allergen Reactions  . Lasix [Furosemide] Other (See Comments)    "syncope"    No family history on file.  Social History Social History   Tobacco Use  . Smoking status: Former Games developer  . Smokeless tobacco: Never Used  Substance Use Topics  . Alcohol use: Never  . Drug use: Never    Review of Systems Constitutional: Negative for fever. Cardiovascular: Negative for chest pain. Respiratory: Negative  for shortness of breath. Gastrointestinal: Left lower quadrant abdominal pain since this morning.  No vomiting or diarrhea. Genitourinary: Negative for urinary compaints Musculoskeletal: Leg swelling, always present per patient. Neurological: Negative for headache All other ROS negative  ____________________________________________   PHYSICAL  EXAM:  Constitutional: Patient is awake and alert, no acute distress, sitting reclined in bed.  No complaints besides left lower quadrant abdominal pain Eyes: Normal exam ENT      Head: Normocephalic and atraumatic.      Mouth/Throat: Mucous membranes are moist. Cardiovascular: Normal rate, regular rhythm. Respiratory: Normal respiratory effort without tachypnea nor retractions. Breath sounds are clear Gastrointestinal: Soft, obese, moderate left lower quadrant and suprapubic tenderness to palpation.  No rebound or guarding. Musculoskeletal: 2+ lower extremity edema in bilateral lower extremities with mild erythema. Neurologic:  Normal speech and language. No gross focal neurologic deficits Skin:  Skin is warm, dry and intact.  Psychiatric: Mood and affect are normal.   ____________________________________________    RADIOLOGY  1. No acute abdominopelvic findings.  2. Colonic diverticulosis without evidence of acute diverticulitis.  3. Cholelithiasis without evidence of acute cholecystitis.  4. Marked prostatomegaly with significant mass effect upon the base  of the urinary bladder. Urinary bladder wall thickening with  multiple diverticula suggestive of chronic bladder outlet  obstruction. Correlation with serum PSA recommended.  5. Celiac trunk measures 15 mm in diameter (previously 14 mm in  2014).  6. Chronic fibrotic changes within the visualized lung bases.  7. Cardiomegaly.  8. Aortic atherosclerosis (ICD10-I70.0).   ____________________________________________   INITIAL IMPRESSION / ASSESSMENT AND PLAN / ED COURSE  Pertinent labs & imaging results that were available during my care of the patient were reviewed by me and considered in my medical decision making (see chart for details).   Patient presents emergency department for left lower quadrant abdominal pain since this morning.  Overall patient appears well, no distress.  Does have moderate tenderness to palpation  of the lower abdomen.  No vomiting or diarrhea.  No dysuria.  We will check labs, urinalysis, CT scan and continue to closely monitor.  Patient's work-up is overall reassuring.  Lab work largely within normal limits including a normal urinalysis.  CT scan abdomen and pelvis shows no acute abnormality.  There is an enlarged prostate.  I spoke to the daughter who is now in the room with the patient regarding this the need to follow-up with his PCP for possible PSA.  Patient continues to state some urinary urgency at times which could be prostate related.  We will send a urine culture as a precaution.  We will place the patient on Flomax and Pyridium.  We will have the patient follow-up with his doctor.  Daughter agreeable to plan of care.  Kyle Frederick was evaluated in Emergency Department on 02/19/2021 for the symptoms described in the history of present illness. He was evaluated in the context of the global COVID-19 pandemic, which necessitated consideration that the patient might be at risk for infection with the SARS-CoV-2 virus that causes COVID-19. Institutional protocols and algorithms that pertain to the evaluation of patients at risk for COVID-19 are in a state of rapid change based on information released by regulatory bodies including the CDC and federal and state organizations. These policies and algorithms were followed during the patient's care in the ED.  ____________________________________________   FINAL CLINICAL IMPRESSION(S) / ED DIAGNOSES  Lower abdominal pain   Minna Antis, MD 02/19/21 1153

## 2021-02-19 NOTE — ED Notes (Signed)
[facility] called for transport back to [facility] 

## 2021-02-27 ENCOUNTER — Non-Acute Institutional Stay: Payer: Medicare Other | Admitting: Adult Health Nurse Practitioner

## 2021-02-27 ENCOUNTER — Other Ambulatory Visit: Payer: Self-pay

## 2021-02-27 DIAGNOSIS — Z515 Encounter for palliative care: Secondary | ICD-10-CM

## 2021-02-27 DIAGNOSIS — J449 Chronic obstructive pulmonary disease, unspecified: Secondary | ICD-10-CM

## 2021-02-27 DIAGNOSIS — I509 Heart failure, unspecified: Secondary | ICD-10-CM

## 2021-02-27 NOTE — Progress Notes (Signed)
Therapist, nutritional Palliative Care Consult Note Telephone: [phone]  Fax: [phone]  PATIENT NAME: Kyle Frederick DOB: Mar 06, 1931 MRN: [id number]  PRIMARY CARE PROVIDER:  Smiley Houseman NP  REFERRING PROVIDER:Melissa Clovis Riley NP  RESPONSIBLE PARTY:Debbie Artis Flock, daughter H: [phone]  C: [phone]  Chief complaint: Follow up palliative visit; edema   RECOMMENDATIONS and PLAN:  1.  Advanced care planning. Patient is a DNR. Spoke with daughter via phone to update on visit.  2.  CHF.  Patient overall at baseline.  Several things have been tried to decrease his edema.  PCP aware of swelling in groin.  Did not notice that patient is being followed by cardiology.  May need cardiology referral for better management if okay with daughter and patient.  Did discuss with daughter today ways to conserve energy.  She did mention that it has been brought up that he may need more skilled nursing instead of assisted living.  We will have social work referral if needed in the future for any transfer of facilities.  3.  COPD.  Patient did have treatment for pneumonia last month but seems to be at baseline now.  Continue recommendations and follow-up by pulmonology.  Palliative will continue to monitor for symptom management/decline and make recommendations as needed. Will follow up in 8-10 weeks.  Daughter encouraged to call with any questions or concerns  HISTORY OF PRESENT ILLNESS:  Kyle Frederick is a 87 y.o. year old male with multiple medical problems including CHF, COPD, history of CVA, HTN, anemia, depression. Palliative Care was asked to help address goals of care.  Reviewed patient's EMR including most recent labs and imaging.  Patient had ED visit on 01/12/2021 and was found to have pneumonia of left upper lobe.  Patient also seen in ED on 02/19/2021 for LLQ pain.  CT scan of abdomen and pelvis was done with no acute abnormalities.  It did show  an enlarged prostate.  He was started on Flomax.  Patient does state having increased shortness of breath with exertion at times.  Does complain of his groin swelling.  Denies pain with the swelling.  Patient has complained about this in the past.  Staff does state that they will find his O2 turned up to 4 L and he is supposed to keep it at 2 L.  Today it is at 2 L.  Patient does state that with exertion when he has increased shortness of breath at times he will bump up his oxygen.  Did reach out to his PCP through [facility] and she did state that they have tried increasing his Demadex, putting wraps on his legs, and using TED hose.  He continues to have significant edema of bilateral lower extremities.  Denies dysuria, hematuria, constipation, N/V/D.  CODE STATUS: DNR  PPS: 30% HOSPICE ELIGIBILITY/DIAGNOSIS: TBD  PHYSICAL EXAM:  HR 61  O2 95% on 2 L General: NAD, frail appearing Eyes: Sclera anicteric and noninjected with no drainage noted.  Cardiovascular: regular rate and rhythm Pulmonary:lung sounds clear; normal respiratory effort Abdomen: soft, nontender, + bowel sounds Extremities: 2+ edema, no joint deformities Skin: no rashes on exposed skin Neurological: Weakness;  HOH;  Patient better answers yes/no questions  PAST MEDICAL HISTORY:  Past Medical History:  Diagnosis Date  . CHF (congestive heart failure) (HCC)   . COPD (chronic obstructive pulmonary disease) (HCC)   . Stroke [facility])     SOCIAL HX:  Social History   Tobacco Use  . Smoking status:  Former Smoker  . Smokeless tobacco: Never Used  Substance Use Topics  . Alcohol use: Never    ALLERGIES:  Allergies  Allergen Reactions  . Lasix [Furosemide] Other (See Comments)    "syncope"     PERTINENT MEDICATIONS:  Outpatient Encounter Medications as of 02/27/2021  Medication Sig  . acetaminophen (TYLENOL) 650 MG CR tablet Take 650 mg by mouth every 8 (eight) hours as needed for pain.  Marland Kitchen arformoterol (BROVANA) 15 MCG/2ML  NEBU USE 1 VIAL IN NEBULIZER TWO TIMES DAILY. (Patient taking differently: Take 15 mcg by nebulization in the morning and at bedtime. USE 1 VIAL IN NEBULIZER TWO TIMES DAILY.)  . aspirin 81 MG chewable tablet Chew 81 mg by mouth daily.  Marland Kitchen azelastine (OPTIVAR) 0.05 % ophthalmic solution Place 1 drop into the right eye in the morning and at bedtime. For 7 days, course starting 02-18-2021  . budesonide (PULMICORT) 0.5 MG/2ML nebulizer solution USE 1 VIAL IN NEBULIZER TWO TIMES DAILY. (Patient taking differently: Take 0.5 mg by nebulization daily. Rinse mouth with water after use, do not swallow)  . bumetanide (BUMEX) 2 MG tablet Take 2 mg by mouth daily.  Marland Kitchen donepezil (ARICEPT) 5 MG tablet Take 5 mg by mouth at bedtime.  . enalapril (VASOTEC) 2.5 MG tablet Take 2.5 mg by mouth daily.  . ferrous sulfate 324 MG TBEC Take 324 mg by mouth daily.  . metolazone (ZAROXOLYN) 2.5 MG tablet Take 2.5 mg by mouth every Monday, Wednesday, and Friday. For 14 days, course starting 02-18-2021 and ending 03-03-2021  . nystatin (MYCOSTATIN/NYSTOP) powder Apply 1 application topically 2 (two) times daily as needed.  . phenazopyridine (PYRIDIUM) 200 MG tablet Take 1 tablet (200 mg total) by mouth 3 (three) times daily as needed for pain.  . phenazopyridine (PYRIDIUM) 200 MG tablet Take 200 mg by mouth 3 (three) times daily as needed for pain.  . polyethylene glycol (MIRALAX / GLYCOLAX) packet Take 1 packet by mouth daily.  . psyllium (METAMUCIL) 58.6 % packet Take 1 packet by mouth daily as needed. With 8 ounces of liquid  . senna-docusate (SENOKOT-S) 8.6-50 MG tablet Take 2 tablets by mouth at bedtime. With a full glass of water  . simvastatin (ZOCOR) 20 MG tablet Take 1 tablet by mouth at bedtime.  . tamsulosin (FLOMAX) 0.4 MG CAPS capsule Take 1 capsule (0.4 mg total) by mouth daily.  . tamsulosin (FLOMAX) 0.4 MG CAPS capsule Take 0.4 mg by mouth at bedtime.  . traZODone (DESYREL) 50 MG tablet Take 50 mg by mouth at  bedtime.   No facility-administered encounter medications on file as of 02/27/2021.      Herman Fiero Marlena Clipper, NP

## 2021-05-02 ENCOUNTER — Non-Acute Institutional Stay: Payer: Medicare Other | Admitting: Adult Health Nurse Practitioner

## 2021-05-02 ENCOUNTER — Encounter: Payer: Self-pay | Admitting: Adult Health Nurse Practitioner

## 2021-05-02 ENCOUNTER — Other Ambulatory Visit: Payer: Self-pay

## 2021-05-02 VITALS — HR 84

## 2021-05-02 DIAGNOSIS — Z515 Encounter for palliative care: Secondary | ICD-10-CM

## 2021-05-02 DIAGNOSIS — J449 Chronic obstructive pulmonary disease, unspecified: Secondary | ICD-10-CM

## 2021-05-02 DIAGNOSIS — I509 Heart failure, unspecified: Secondary | ICD-10-CM

## 2021-05-02 NOTE — Progress Notes (Signed)
Therapist, nutritional Palliative Care Consult Note Telephone: [phone]  Fax: [phone]    Date of encounter: 05/02/21 PATIENT NAME: Kyle Frederick [address] [place] [address]   [phone] (home)  DOB: 01-05-1931 MRN: [id number] PRIMARY CARE PROVIDER:    Smiley Houseman NP  REFERRING PROVIDER:   Smiley Houseman NP  RESPONSIBLE PARTY:    Contact Information    Name Relation Home Work Mobile   Kyle Frederick Daughter [phone]  [phone]   Kyle Frederick   [phone]       I met face to face with patient in facility. Palliative Care was asked to follow this patient by consultation request of  Kyle Houseman NP to address advance care planning and complex medical decision making. This is a follow up visit.  Will call daughter to update on visit                                   ASSESSMENT AND PLAN / RECOMMENDATIONS:   Advance Care Planning/Goals of Care: Goals include to maximize quality of life and symptom management.   CODE STATUS: DNR  Symptom Management/Plan:  CHF: Patient continues to have edema and today has wraps in place on bilateral lower extremities.  Patient is not complaining of increased shortness of breath or cough.  Continue current plan of care and follow-up by PCP.  COPD: This is stable.  Patient continues on 2 L O2 continuous.  Continue current plan of care and follow-up by PCP   Follow up Palliative Care Visit: Palliative care will continue to follow for complex medical decision making, advance care planning, and clarification of goals. Return 8-12 weeks or prn.  I spent 30 minutes providing this consultation. More than 50% of the time in this consultation was spent in counseling and care coordination.  PPS: 30%  HOSPICE ELIGIBILITY/DIAGNOSIS: TBD  Chief Complaint: follow up palliative visit  HISTORY OF PRESENT ILLNESS:  Kyle Frederick is a 87 y.o. year old male  with CHF, COPD, history of  CVA, HTN, anemia, depression.  Patient is having no new concerns today.  Patient is hard of hearing and is having difficulties hearing this provider today.  HPI/ROS limited due to patient being hard of hearing.  Staff have no new concerns.  Patient's appetite is good with no reported weight loss.  Patient has not had any falls, infection, hospital visits since last visit.  History obtained from review of EMR and interview with facility staff and Kyle Frederick.   PHYSICAL EXAM:  General: NAD, frail appearing Eyes: Sclera anicteric and noninjected with no drainage noted.  Cardiovascular: regular rate and rhythm Pulmonary:lung sounds clear; normal respiratory effort Abdomen: soft, nontender, + bowel sounds Extremities:legs have wraps in place with no noted edema above or below the wraps, no joint deformities Skin: no rasheson exposed skin Neurological: Weakness; HOH; Patient better answers yes/no questions  Thank you for the opportunity to participate in the care of Kyle Frederick.  The palliative care team will continue to follow. Please call our office at [phone] if we can be of additional assistance.   Jihaad Bruschi Marlena Clipper, NP , DNP  This chart was dictated using voice recognition software. Despite best efforts to proofread, errors can occur which can change the documentation meaning.   COVID-19 PATIENT SCREENING TOOL Asked and negative response unless otherwise noted:   Have you had symptoms of covid, tested positive  or been in contact with someone with symptoms/positive test in the past 5-10 days? negative

## 2021-05-03 ENCOUNTER — Telehealth: Payer: Self-pay | Admitting: Adult Health Nurse Practitioner

## 2021-05-03 NOTE — Telephone Encounter (Signed)
Spoke with daughter to update on yesterday's visit.  She did have some concerns about bills for medications.  States that he refuses his Rosalyn Gess and would like this discontinued.  Will reach out to PCP for further review of his meds. Carl Bleecker K. Garner Nash NP

## 2021-07-18 ENCOUNTER — Non-Acute Institutional Stay: Payer: Medicare Other | Admitting: Student

## 2021-07-18 ENCOUNTER — Other Ambulatory Visit: Payer: Self-pay

## 2021-07-18 DIAGNOSIS — Z515 Encounter for palliative care: Secondary | ICD-10-CM

## 2021-07-18 DIAGNOSIS — R300 Dysuria: Secondary | ICD-10-CM

## 2021-07-18 DIAGNOSIS — J449 Chronic obstructive pulmonary disease, unspecified: Secondary | ICD-10-CM

## 2021-07-18 DIAGNOSIS — R0602 Shortness of breath: Secondary | ICD-10-CM

## 2021-07-18 DIAGNOSIS — I509 Heart failure, unspecified: Secondary | ICD-10-CM

## 2021-07-19 NOTE — Progress Notes (Signed)
Designer, [facility]ell[facility] Palliative Care Consult Note Telephone: [phone]  Fax: [phone]    Date of encounter: 07/18/2021  PATIENT NAME: Kyle Frederick Kyle Frederick [address]   [phone] (home)  DOB: July 16, 1931 MRN: [id number] PRIMARY CARE PROVIDER:    Thea Gist, NP  REFERRING PROVIDER:   Thea Gist, NP  RESPONSIBLE PARTY:    Contact Information     Name Relation Home Work Mobile   Kyle Frederick Daughter [phone]  [phone]   Kyle Frederick   [phone]        I met face to face with patient in the facility. Palliative Care was asked to follow this patient by consultation request of  Kyle Hire, MD to address advance care planning and complex medical decision making. This is a follow up visit.                                   ASSESSMENT AND PLAN / RECOMMENDATIONS:   Advance Care Planning/Goals of Care: Goals include to maximize quality of life and symptom management.   CODE STATUS: will clarify with responsible party as facility chart reflects Full Code, while previously he was listed as a DNR. No DNR visualized on chart.  Symptom Management/Plan:  CHF-patient with pedal edema. Patient encouraged to elevate legs when in recliner or bed. Continue bumetanide, metolazone, enalapril as directed. Daily weights as directed per facility. Follow up with PCP as scheduled.   Shortness of breath-secondary to CHF and COPD. Continue oxygen via nasal canula at 2-3 lpm. Encourage patient to use nebulizer treatments as directed.   Dysuria- U/A, C & S. Acetaminophen 675m every 6 hours PRN pain/fever.   Follow up Palliative Care Visit: Palliative care will continue to follow for complex medical decision making, advance care planning, and clarification of goals. Return in 6-8 weeks or prn.  I spent 25 minutes providing this consultation. More than 50% of the time in this consultation was spent in counseling and  care coordination.   PPS: 40%  HOSPICE ELIGIBILITY/DIAGNOSIS: TBD  Chief Complaint: Palliative Medicine follow up visit.   HISTORY OF PRESENT ILLNESS:  Kyle DONLEYis a 87y.o. year old male  with congestive heart failure, hypertensive heart disease, COPD, anemia, depression, hx of CVA.  [facility] states his breathing is okay. He wears oxygen via nasal canula at 3 lpm. Staff report patient frequently refusing his breathing treatments. Patient reports having pain, burning with urination. He denies hematuria. Good appetite reported although staff state he did not eat breakfast today. Patient's is hard of hearing, which limits communication; he is able to answer direct questions and repeat back what is said to him. No recent falls reported. A 10 point review of systems is negative, except for the pertinent positives and negatives detailed in the HPI.   History obtained from review of EMR, discussion with primary team, and interview with family, facility staff/caregiver and/or Kyle Frederick  I reviewed available labs, medications, imaging, studies and related documents from the EMR.  Records reviewed and summarized above.    Physical Exam:  Constitutional: NAD General: frail appearing EYES: anicteric sclera, lids intact, no discharge  ENMT: hard of hearing, oral mucous membranes moist CV: S1S2, RRR, 2+ pedal edema Pulmonary: LCTA, no increased work of breathing, no cough, sats 98% at 3 lpm Abdomen: normo-active BS + 4 quadrants, soft, tender  to palpation GU: deferred MSK: moves all extremities, w/c for locomotion, takes several steps to transfer Skin: warm and dry, no rashes or wounds on visible skin Neuro: generalized weakness, A & O x 2 Psych: non-anxious affect Hem/lymph/immuno: no widespread bruising   Thank you for the opportunity to participate in the care of Kyle Frederick.  The palliative care team will continue to follow. Please call our  office at [phone] if we can be of additional assistance.   Kyle Slocumb, NP   COVID-19 PATIENT SCREENING TOOL Asked and negative response unless otherwise noted:   Have you had symptoms of covid, tested positive or been in contact with someone with symptoms/positive test in the past 5-10 days?  No

## 2021-08-31 DEATH — deceased
